# Patient Record
Sex: Female | Born: 1970 | Race: Black or African American | Hispanic: Refuse to answer | Marital: Single | Smoking: Former smoker
Health system: Southern US, Community
[De-identification: ages and names within clinical notes are randomized; demographics above are authoritative.]

## PROBLEM LIST (undated history)

## (undated) DIAGNOSIS — E785 Hyperlipidemia, unspecified: Secondary | ICD-10-CM

## (undated) DIAGNOSIS — I6990 Unspecified sequelae of unspecified cerebrovascular disease: Secondary | ICD-10-CM

## (undated) DIAGNOSIS — E559 Vitamin D deficiency, unspecified: Secondary | ICD-10-CM

## (undated) DIAGNOSIS — M810 Age-related osteoporosis without current pathological fracture: Secondary | ICD-10-CM

## (undated) DIAGNOSIS — J301 Allergic rhinitis due to pollen: Secondary | ICD-10-CM

## (undated) DIAGNOSIS — R7989 Other specified abnormal findings of blood chemistry: Secondary | ICD-10-CM

## (undated) DIAGNOSIS — F17200 Nicotine dependence, unspecified, uncomplicated: Secondary | ICD-10-CM

## (undated) DIAGNOSIS — R5381 Other malaise: Secondary | ICD-10-CM

## (undated) DIAGNOSIS — K5900 Constipation, unspecified: Secondary | ICD-10-CM

## (undated) DIAGNOSIS — I509 Heart failure, unspecified: Secondary | ICD-10-CM

## (undated) DIAGNOSIS — R569 Unspecified convulsions: Secondary | ICD-10-CM

## (undated) DIAGNOSIS — D869 Sarcoidosis, unspecified: Secondary | ICD-10-CM

## (undated) DIAGNOSIS — K2100 Gastro-esophageal reflux disease with esophagitis, without bleeding: Secondary | ICD-10-CM

## (undated) DIAGNOSIS — F1420 Cocaine dependence, uncomplicated: Secondary | ICD-10-CM

## (undated) DIAGNOSIS — F329 Major depressive disorder, single episode, unspecified: Secondary | ICD-10-CM

## (undated) DIAGNOSIS — F3289 Other specified depressive episodes: Secondary | ICD-10-CM

## (undated) DIAGNOSIS — G40909 Epilepsy, unspecified, not intractable, without status epilepticus: Secondary | ICD-10-CM

## (undated) DIAGNOSIS — F319 Bipolar disorder, unspecified: Secondary | ICD-10-CM

## (undated) DIAGNOSIS — G609 Hereditary and idiopathic neuropathy, unspecified: Secondary | ICD-10-CM

## (undated) DIAGNOSIS — G4730 Sleep apnea, unspecified: Secondary | ICD-10-CM

## (undated) DIAGNOSIS — J45909 Unspecified asthma, uncomplicated: Secondary | ICD-10-CM

## (undated) DIAGNOSIS — I10 Essential (primary) hypertension: Secondary | ICD-10-CM

## (undated) DIAGNOSIS — R5383 Other fatigue: Secondary | ICD-10-CM

## (undated) DIAGNOSIS — E669 Obesity, unspecified: Secondary | ICD-10-CM

## (undated) HISTORY — DX: Gastro-esophageal reflux disease with esophagitis, without bleeding: K21.00

## (undated) HISTORY — DX: Unspecified asthma, uncomplicated: J45.909

## (undated) HISTORY — DX: Unspecified sequelae of unspecified cerebrovascular disease: I69.90

## (undated) HISTORY — DX: Cocaine dependence, uncomplicated: F14.20

## (undated) HISTORY — DX: Major depressive disorder, single episode, unspecified: F32.9

## (undated) HISTORY — DX: Constipation, unspecified: K59.00

## (undated) HISTORY — PX: LUNG BIOPSY: SHX232

## (undated) HISTORY — DX: Allergic rhinitis due to pollen: J30.1

## (undated) HISTORY — DX: Sleep apnea, unspecified: G47.30

## (undated) HISTORY — DX: Hyperlipidemia, unspecified: E78.5

## (undated) HISTORY — DX: Sarcoidosis, unspecified: D86.9

## (undated) HISTORY — DX: Other specified abnormal findings of blood chemistry: R79.89

## (undated) HISTORY — PX: CHOLECYSTECTOMY: SHX55

## (undated) HISTORY — DX: Age-related osteoporosis without current pathological fracture: M81.0

## (undated) HISTORY — DX: Other fatigue: R53.83

## (undated) HISTORY — DX: Gastro-esophageal reflux disease with esophagitis: K21.0

## (undated) HISTORY — PX: FRACTURE SURGERY: SHX138

## (undated) HISTORY — DX: Unspecified convulsions: R56.9

## (undated) HISTORY — DX: Vitamin D deficiency, unspecified: E55.9

## (undated) HISTORY — DX: Other malaise: R53.81

## (undated) HISTORY — DX: Nicotine dependence, unspecified, uncomplicated: F17.200

## (undated) HISTORY — DX: Other specified depressive episodes: F32.89

## (undated) HISTORY — DX: Obesity, unspecified: E66.9

## (undated) HISTORY — DX: Hereditary and idiopathic neuropathy, unspecified: G60.9

---

## 2004-10-17 ENCOUNTER — Emergency Department: Payer: Self-pay | Admitting: Emergency Medicine

## 2005-01-14 ENCOUNTER — Emergency Department: Payer: Self-pay | Admitting: Emergency Medicine

## 2005-10-07 ENCOUNTER — Emergency Department: Payer: Self-pay | Admitting: Emergency Medicine

## 2005-11-07 ENCOUNTER — Emergency Department: Payer: Self-pay | Admitting: Internal Medicine

## 2005-11-11 ENCOUNTER — Ambulatory Visit: Payer: Self-pay | Admitting: Family Medicine

## 2005-11-14 ENCOUNTER — Emergency Department: Payer: Self-pay | Admitting: General Practice

## 2005-12-12 ENCOUNTER — Emergency Department: Payer: Self-pay | Admitting: General Practice

## 2005-12-12 ENCOUNTER — Other Ambulatory Visit: Payer: Self-pay

## 2006-01-20 ENCOUNTER — Emergency Department: Payer: Self-pay | Admitting: Emergency Medicine

## 2006-02-05 ENCOUNTER — Inpatient Hospital Stay (HOSPITAL_COMMUNITY): Admission: AD | Admit: 2006-02-05 | Discharge: 2006-02-12 | Payer: Self-pay | Admitting: Psychiatry

## 2006-02-06 ENCOUNTER — Ambulatory Visit: Payer: Self-pay | Admitting: Psychiatry

## 2006-05-22 ENCOUNTER — Emergency Department: Payer: Self-pay | Admitting: Emergency Medicine

## 2006-07-08 ENCOUNTER — Inpatient Hospital Stay: Payer: Self-pay | Admitting: Unknown Physician Specialty

## 2006-10-23 ENCOUNTER — Emergency Department (HOSPITAL_COMMUNITY): Admission: EM | Admit: 2006-10-23 | Discharge: 2006-10-23 | Payer: Self-pay | Admitting: Emergency Medicine

## 2007-02-23 ENCOUNTER — Emergency Department (HOSPITAL_COMMUNITY): Admission: EM | Admit: 2007-02-23 | Discharge: 2007-02-23 | Payer: Self-pay | Admitting: Emergency Medicine

## 2007-03-15 ENCOUNTER — Emergency Department (HOSPITAL_COMMUNITY): Admission: EM | Admit: 2007-03-15 | Discharge: 2007-03-15 | Payer: Self-pay | Admitting: Family Medicine

## 2007-03-27 ENCOUNTER — Emergency Department (HOSPITAL_COMMUNITY): Admission: EM | Admit: 2007-03-27 | Discharge: 2007-03-27 | Payer: Self-pay | Admitting: Emergency Medicine

## 2007-04-13 ENCOUNTER — Emergency Department: Payer: Self-pay | Admitting: General Practice

## 2007-08-13 ENCOUNTER — Ambulatory Visit: Payer: Self-pay

## 2008-02-18 ENCOUNTER — Ambulatory Visit: Payer: Self-pay | Admitting: Obstetrics and Gynecology

## 2008-02-26 ENCOUNTER — Ambulatory Visit: Payer: Self-pay | Admitting: Obstetrics and Gynecology

## 2008-06-28 ENCOUNTER — Ambulatory Visit: Payer: Self-pay

## 2008-06-29 ENCOUNTER — Other Ambulatory Visit: Payer: Self-pay

## 2008-06-30 ENCOUNTER — Inpatient Hospital Stay: Payer: Self-pay | Admitting: Internal Medicine

## 2008-08-16 ENCOUNTER — Ambulatory Visit: Payer: Self-pay | Admitting: Internal Medicine

## 2008-08-25 ENCOUNTER — Ambulatory Visit: Payer: Self-pay | Admitting: Internal Medicine

## 2008-09-01 ENCOUNTER — Ambulatory Visit: Payer: Self-pay | Admitting: Family Medicine

## 2008-09-01 ENCOUNTER — Ambulatory Visit: Payer: Self-pay | Admitting: Internal Medicine

## 2008-09-20 ENCOUNTER — Ambulatory Visit: Payer: Self-pay | Admitting: Thoracic Surgery (Cardiothoracic Vascular Surgery)

## 2008-09-22 ENCOUNTER — Ambulatory Visit: Payer: Self-pay | Admitting: Infectious Diseases

## 2008-09-26 ENCOUNTER — Inpatient Hospital Stay (HOSPITAL_COMMUNITY)
Admission: RE | Admit: 2008-09-26 | Discharge: 2008-09-30 | Payer: Self-pay | Admitting: Thoracic Surgery (Cardiothoracic Vascular Surgery)

## 2008-09-26 ENCOUNTER — Encounter: Payer: Self-pay | Admitting: Thoracic Surgery (Cardiothoracic Vascular Surgery)

## 2008-09-29 ENCOUNTER — Ambulatory Visit: Payer: Self-pay | Admitting: Thoracic Surgery (Cardiothoracic Vascular Surgery)

## 2008-10-19 ENCOUNTER — Ambulatory Visit: Payer: Self-pay | Admitting: Thoracic Surgery (Cardiothoracic Vascular Surgery)

## 2008-11-04 ENCOUNTER — Encounter
Admission: RE | Admit: 2008-11-04 | Discharge: 2008-11-04 | Payer: Self-pay | Admitting: Thoracic Surgery (Cardiothoracic Vascular Surgery)

## 2008-11-04 ENCOUNTER — Ambulatory Visit: Payer: Self-pay | Admitting: Thoracic Surgery (Cardiothoracic Vascular Surgery)

## 2009-01-18 ENCOUNTER — Emergency Department (HOSPITAL_COMMUNITY): Admission: EM | Admit: 2009-01-18 | Discharge: 2009-01-18 | Payer: Self-pay | Admitting: Emergency Medicine

## 2009-03-06 ENCOUNTER — Emergency Department (HOSPITAL_COMMUNITY): Admission: EM | Admit: 2009-03-06 | Discharge: 2009-03-06 | Payer: Self-pay | Admitting: Emergency Medicine

## 2009-03-24 ENCOUNTER — Emergency Department (HOSPITAL_COMMUNITY): Admission: EM | Admit: 2009-03-24 | Discharge: 2009-03-24 | Payer: Self-pay | Admitting: Emergency Medicine

## 2009-03-30 ENCOUNTER — Emergency Department (HOSPITAL_COMMUNITY): Admission: EM | Admit: 2009-03-30 | Discharge: 2009-03-30 | Payer: Self-pay | Admitting: Family Medicine

## 2009-06-04 ENCOUNTER — Emergency Department (HOSPITAL_COMMUNITY): Admission: EM | Admit: 2009-06-04 | Discharge: 2009-06-04 | Payer: Self-pay | Admitting: Family Medicine

## 2009-09-10 ENCOUNTER — Emergency Department (HOSPITAL_COMMUNITY): Admission: EM | Admit: 2009-09-10 | Discharge: 2009-09-11 | Payer: Self-pay | Admitting: Emergency Medicine

## 2009-09-15 ENCOUNTER — Emergency Department (HOSPITAL_COMMUNITY): Admission: EM | Admit: 2009-09-15 | Discharge: 2009-09-15 | Payer: Self-pay | Admitting: Family Medicine

## 2009-10-05 ENCOUNTER — Encounter: Admission: RE | Admit: 2009-10-05 | Discharge: 2009-10-05 | Payer: Self-pay | Admitting: Internal Medicine

## 2010-02-16 ENCOUNTER — Emergency Department (HOSPITAL_COMMUNITY): Admission: EM | Admit: 2010-02-16 | Discharge: 2010-02-17 | Payer: Self-pay | Admitting: Emergency Medicine

## 2010-03-26 ENCOUNTER — Encounter: Admission: RE | Admit: 2010-03-26 | Discharge: 2010-03-26 | Payer: Self-pay | Admitting: Family Medicine

## 2010-12-30 DIAGNOSIS — F1420 Cocaine dependence, uncomplicated: Secondary | ICD-10-CM

## 2010-12-30 HISTORY — DX: Cocaine dependence, uncomplicated: F14.20

## 2011-01-20 ENCOUNTER — Encounter: Payer: Self-pay | Admitting: Thoracic Surgery (Cardiothoracic Vascular Surgery)

## 2011-03-20 LAB — POCT I-STAT, CHEM 8
Chloride: 111 mEq/L (ref 96–112)
Creatinine, Ser: 0.9 mg/dL (ref 0.4–1.2)
Glucose, Bld: 122 mg/dL — ABNORMAL HIGH (ref 70–99)
Potassium: 3.6 mEq/L (ref 3.5–5.1)
Sodium: 144 mEq/L (ref 135–145)

## 2011-04-05 LAB — COMPREHENSIVE METABOLIC PANEL
ALT: 11 U/L (ref 0–35)
AST: 16 U/L (ref 0–37)
Alkaline Phosphatase: 51 U/L (ref 39–117)
CO2: 26 mEq/L (ref 19–32)
Calcium: 9.1 mg/dL (ref 8.4–10.5)
Chloride: 107 mEq/L (ref 96–112)
GFR calc non Af Amer: >60 mL/min (ref >60)
Glucose, Bld: 96 mg/dL (ref 70–99)
Sodium: 138 mEq/L (ref 135–145)
Total Bilirubin: 0.8 mg/dL (ref 0.3–1.2)

## 2011-04-05 LAB — CBC
Hemoglobin: 13.9 g/dL (ref 12.0–15.0)
MCHC: 34.5 g/dL (ref 30.0–36.0)
RBC: 4.32 MIL/uL (ref 3.87–5.11)
WBC: 8.1 10*3/uL (ref 4.0–10.5)

## 2011-04-05 LAB — WET PREP, GENITAL

## 2011-04-05 LAB — URINALYSIS, ROUTINE W REFLEX MICROSCOPIC
Bilirubin Urine: NEGATIVE
Ketones, ur: NEGATIVE mg/dL
Nitrite: NEGATIVE
Protein, ur: NEGATIVE mg/dL
Urobilinogen, UA: 1 mg/dL (ref 0.0–1.0)

## 2011-04-05 LAB — GC/CHLAMYDIA PROBE AMP, GENITAL
Chlamydia, DNA Probe: NEGATIVE
GC Probe Amp, Genital: NEGATIVE

## 2011-04-05 LAB — DIFFERENTIAL
Basophils Absolute: 0.1 10*3/uL (ref 0.0–0.1)
Basophils Relative: 2 % — ABNORMAL HIGH (ref 0–1)
Eosinophils Absolute: 0.4 10*3/uL (ref 0.0–0.7)
Eosinophils Relative: 5 % (ref 0–5)
Lymphs Abs: 2.1 10*3/uL (ref 0.7–4.0)
Neutrophils Relative %: 58 % (ref 43–77)

## 2011-04-05 LAB — HEMOGLOBIN AND HEMATOCRIT, BLOOD: HCT: 37.5 % (ref 36.0–46.0)

## 2011-04-05 LAB — PREGNANCY, URINE: Preg Test, Ur: NEGATIVE

## 2011-04-10 LAB — POCT RAPID STREP A (OFFICE): Streptococcus, Group A Screen (Direct): NEGATIVE

## 2011-04-11 LAB — WET PREP, GENITAL
Trich, Wet Prep: NONE SEEN
Yeast Wet Prep HPF POC: NONE SEEN

## 2011-04-11 LAB — POCT URINALYSIS DIP (DEVICE)
Bilirubin Urine: NEGATIVE
Hgb urine dipstick: NEGATIVE
Nitrite: NEGATIVE
Protein, ur: 30 mg/dL — AB
pH: 6.5 (ref 5.0–8.0)

## 2011-05-14 NOTE — Assessment & Plan Note (Signed)
OFFICE VISIT   HOGSTON, Adisson A  DOB:  1971-10-13                                        November 04, 2008  CHART #:  [id number]   The patient is a 40 year old woman who had a left VATS, lung biopsy,  mediastinal node biopsies on September 26, 2008, for what turned out to  be sarcoidosis and originally Pathology and ID were concerned that this  might be tuberculosis, but no organisms were seen and the findings were,  in fact, more consistent with sarcoidosis.  She has been treated with  steroids.  She states that her breathing has been good.  She is having  some incisional pain and also some numbness or feeling of a lump in the  left subcostal region.   PHYSICAL EXAMINATION:  GENERAL:  The patient is a 40 year old African  American female in no acute distress.  VITAL SIGNS:  Her blood pressure 143/91, pulse 54, respirations are 18,  her oxygen saturation is 97% on room air.  LUNGS:  Clear with equal breath sounds.  SKIN:  Her incisions are all healing well with no signs of infection.   Chest x-ray shows some improvement of her patchy bilateral infiltrates.   IMPRESSION:  The patient is a 39 year old woman with sarcoid.  She is  status post left VATS for biopsy of mediastinal nodes and lung.  Her  adenopathy was more in the AP window than it was paratracheal that is  why a VATS approach was utilized.  She is doing well at this point in  time.  She does still have some discomfort.  I gave her additional  prescription for hydrocodone/acetaminophen 7.5/500 one to two tablets 3  times daily as needed and gave her #50 tablets.  No refills.  I  encouraged her to use Tylenol or ibuprofen for achy-type pain and only  to use hydrocodone when the pain was more severe.  Her activities are  unrestricted, but she is to  avoid things that cause significant discomfort.  She will continue to be  followed by Dr. Welton Flakes.  I would be happy to see her back anytime if I  could  be of any further assistance with her care.   Salvatore Decent Dorris Fetch, M.D.  Electronically Signed   SCH/MEDQ  D:  11/04/2008  T:  11/04/2008  Job:  [id number]   cc:   Freda Munro

## 2011-05-14 NOTE — Discharge Summary (Signed)
[name], [name]              ACCOUNT NO.:  [id number]   MEDICAL RECORD NO.:  [id number]          PATIENT TYPE:  INP   LOCATION:  2033                         FACILITY:  [facility]   PHYSICIAN:  Salvatore Decent. Dorris Fetch, M.D.DATE OF BIRTH:  [birth year]   DATE OF ADMISSION:  09/26/2008  DATE OF DISCHARGE:  09/30/2008                               DISCHARGE SUMMARY   PRIMARY ADMITTING DIAGNOSIS:  Bilateral pulmonary infiltrates.   ADDITIONAL/DISCHARGE DIAGNOSES:  1. Bilateral pulmonary infiltrates questionable sarcoidosis.  2. History of tobacco abuse.  3. Hypertension.  4. Arthritis.  5. Gastroesophageal reflux.   PROCEDURE PERFORMED:  Left video-assisted thoracoscopic surgery, left  upper lobe wedge resection, and lymph node dissection.   HISTORY:  The patient is a 40 year old female who developed shortness of  breath around June 2009 and was diagnosed and treated for pneumonia at  that time by Dr. Welton Flakes.  She underwent a followup chest CT and this  showed interstitial and alveolar lung disease prominently in the upper  lobes with mediastinal and hilar adenopathy.  She returned for a  followup CT scan in August 2009 which showed significant change.  She  underwent a bronchoscopy which was diagnostic.  She has been treated  with antibiotics prednisone and Symbicort with a persistent cough but  improvement of her shortness of breath.  She has continued to have some  mild dyspnea, but has had no weight loss or hemoptysis.  She was  referred to Dr. Charlett Lango for consideration of surgical biopsy  for diagnosis.  Dr. Dorris Fetch reviewed her CT scan which confirmed  bilateral interstitial lung disease with some nodularity as well as  alveolar infiltrates bilaterally.  There was some minimal hilar and  mediastinal adenopathy and prominent adenopathy in the AP window.  Dr.  Dorris Fetch felt that she would benefit from a VATS with lung biopsy at  this time.  He explained the risks,  benefits, and alternatives of the  procedure to the patient, and she agreed to proceed.   HOSPITAL COURSE:  She was admitted to [facility] on September 26, 2008.  She underwent a left VATS with left upper lobe wedge  resection.  Her intraoperative frozen section showed granulomatous  disease with central necrosis.  She tolerated the procedure well and was  transferred to the Intermediate Care Unit postoperatively in stable  condition.  Because of her lung biopsy findings, an ID consult was  obtained and the patient was seen by Dr. Ninetta Lights.  She was placed on  respiratory isolation for presumed TB.  Her postoperative course has  progressed as expected.  Her chest tube drainage gradually decreased and  no air leak was noted and all chest tubes were discontinued by  postoperative day 2.  She has been ambulating in the hall without  problem.  She is tolerating her regular diet.  She has been afebrile,  and her vital signs have been stable.  She is maintaining O2 sats of  greater than 90% on room air.  Her AFB smear came back negative,  although cultures are still pending at this time.  Other  tests including  GC, Chlamydia, HIV, and RPR were all negative.  Her final intraoperative  pathology was positive for granulomatous inflammation with rare focus of  necrosis.  There was no evidence of malignancy.  Again stains were  negative for AFB.  It was Dr. Moshe Cipro opinion that this was most  likely consistent with sarcoid rather than tuberculosis.  Because of  these findings, her respiratory isolation was discontinued.  She has now  been transferred to the Step-down Unit and is progressing well.  Her  most recent labs showed a hemoglobin of 13.5, hematocrit of 40, white  count 13.6, and platelets 375.  Sodium 140, potassium 4.1, BUN 7, and  creatinine 0.75.  Both urine histo and serum crypto antigens have been  ordered but remained pending at the time of this dictation.  She will   undergo a PA and lateral chest x-ray on the morning of September 30, 2008.  It was anticipated that she has remained stable on her chest x-ray, has  continued to improve.  She hopefully will be ready for discharge home.   DISCHARGE MEDICATIONS:  1. Alprazolam 1 mg p.r.n. as directed.  2. Ambien CR 12.5 mg nightly.  3. Oxycodone 5/325 mg 1-2 q.4-6 h. p.r.n. for pain.  4. Pulmicort 2 puffs daily as directed.  5. Predinsone 40 mg daily.  6. Micardis 80 mg daily.  7. Peri-Colace 1 q.p.m.  8. Kapidex 60 mg daily.  9. Boniva monthly.   DISCHARGE INSTRUCTIONS:  She was asked to refrain from driving, heavy  lifting, or strenuous activity.  She may continue ambulating daily and  using her incentive spirometer.  She may shower daily and clean her  incisions with soap and water.   DISCHARGE FOLLOWUP:  She will follow up with Dr. Dorris Fetch on October 27, 2008, at 12:45 p.m.  She will have chest x-ray prior to this visit.  If she experiences any problems, or has questions in the interim, she is  asked to contact our office immediately.  Also we will followup on her  cultures and if she needs further ID followup, this will be arranged at  the time of discharge.      Coral Ceo, P.A.      Salvatore Decent Dorris Fetch, M.D.  Electronically Signed    GC/MEDQ  D:  09/29/2008  T:  09/30/2008  Job:  [id number]   cc:   Lacretia Leigh. Ninetta Lights, M.D.  Freda Munro

## 2011-05-14 NOTE — Op Note (Signed)
NAMELACIE, LANDRY              ACCOUNT NO.:  [id number]   MEDICAL RECORD NO.:  [id number]          PATIENT TYPE:  INP   LOCATION:  3301                         FACILITY:  [facility]   PHYSICIAN:  Salvatore Decent. Dorris Fetch, M.D.DATE OF BIRTH:  [birth year]   DATE OF PROCEDURE:  09/26/2008  DATE OF DISCHARGE:                               OPERATIVE REPORT   PREOPERATIVE DIAGNOSIS:  Bilateral pulmonary infiltrates and adenopathy.   POSTOPERATIVE DIAGNOSIS:  Granulomatous disease with central necrosis.   PROCEDURE:  Left VATS, lung biopsy, and mediastinal node biopsy.   SURGEON:  Salvatore Decent. Dorris Fetch, MD   ASSISTANT:  Doree Fudge, PA.   ANESTHESIA:  General.   FINDINGS:  Miliary type pattern visible on the visceral pleural surface,  extensive scarring of the lung, enlarged AP window lymph nodes.  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   CLINICAL NOTE:  Ms. Schlotterbeck is a 40 year old woman with bilateral  pulmonary infiltrates and mediastinal and hilar adenopathy.  She had  been treated with steroids and antibiotics but has had persistent cough.  She says that the cough has been nonproductive.  She also has a history  of tobacco abuse as well as some psychiatric history.  The patient had a  bronchoscopy which was nondiagnostic.  She was advised to undergo left  VATS with lung and node biopsies versus mediastinoscopy.  Relative  advantages and disadvantages of each approach were discussed and the  patient strongly wished to proceed with VATS approach.  She understood  the indications, risks, benefits, and alternatives, accepted risks and  agreed to proceed.   OPERATIVE NOTE:  Ms. Counts was brought to the preop holding area on  September 26, 2008.  There Anesthesia established intravenous access,  central venous access, and arterial blood pressure monitoring catheter  was placed.  PAS hose were placed for DVT prophylaxis.  She was taken to  the  operating room, anesthetized, and intubated.  A Foley catheter was  placed.  She was intubated with a double-lumen endotracheal tube.  She  was placed in the right lateral decubitus position, and the left chest  was prepped and draped in the usual fashion.  Single lung ventilation of  right lung was carried out and was tolerated well throughout the  procedure.   A transverse incision was made approximately at the seventh intercostal  space in the midaxillary line and was carried through the skin and  subcutaneous tissue.  The chest was entered bluntly using hemostat.  A  port was inserted and the thoracoscope was placed.  There was residual  air in the lung although no significant cross ventilation.  A suction  catheter was placed into the left side with the double-lumen tube and  this helped significantly with evacuating the air from the lung.  There  was no pleural effusion.  The parietal pleura appeared normal.  There  was a miliary-type pattern seen in the lung.  Additional access ports  were placed anterior and posteriorly in the fifth intercostal space.  The lung was retracted laterally exposing the hilum and AP window.  A  small hilar node was identified.  This did not appear pathologic.  It  was taken and sent for permanent sections.  There was significant  adenopathy in the AP window distribution.  Nodes were sampled from this  area taking care to preserve the phrenic and recurrent nerves.  Finally  the area in the lingula with a miliary pattern of fine nodules was  identified and resected with multiple firings of Endo GIA type stapler  with 4.8 mm staple depth.  The specimen was divided.  The site of  nodularity was marked and sent for frozen section.  Additional side  nodularity was sent for cultures for bacterial, fungus, and AFB.  The  frozen section returned granuloma with central necrosis concerning for  tuberculosis.  The AP window nodes were divided and sent both for   pathology as well as AFB stains and cultures.  A 28-French chest tube  was placed in the anterior most incision and an On-Q local anesthetic  catheter was tunneled into a subpleural location and 0.25% Marcaine was  injected.  The lung was inflated.  The staple line was inspected as was  the site where the nodal dissection was performed.  The lung was  reinflated.  The chest tube was placed to suction.  The patient was  extubated in the operating room and taken to the recovery room in good  condition.      Salvatore Decent Dorris Fetch, M.D.  Electronically Signed     SCH/MEDQ  D:  09/26/2008  T:  09/27/2008  Job:  [id number]   cc:   Freda Munro

## 2011-05-14 NOTE — H&P (Signed)
HISTORY AND PHYSICAL EXAMINATION   September 20, 2008   Re:  RAYLEN, KEN          DOB:  06/25/1971   The patient is a 40 year old woman sent for consultation regarding  possible sarcoidosis.   The patient is a 40 year old woman with a past medical history  significant for hypertension and arthritis.  She also has a history of  tobacco abuse.  She did not notice any difficulty with her breathing up  until June of this year when she was experiencing shortness of breath.  She was diagnosed with pneumonia and treated with antibiotics.  She was  seen by Dr. Welton Flakes.  A chest CT showed interstitial and alveolar lung  disease predominantly in the upper lobes.  There was also some  mediastinal and hilar adenopathy.  She had a repeat CT scan done in  August, which showed no significant change.  She underwent bronchoscopy,  which was nondiagnostic.  She has been treated with Symbicort,  prednisone, and antibiotics.  She currently is on erythromycin.  She has  had persistent cough.  Her shortness of breath has improved, but not  resolved.  Her breathing is still worse than it was prior to getting  ill.   The patient denies any fevers or chills but says she has been having hot  flashes, she had an IUD put in recently.  She says she has been tested  for pregnancy and that has been negative.  She also has chronic back and  shoulder pain and is on oxycodone for that.   She does smoke.  She had quit smoking 2 years ago, but had recently  restarted and says she is now smoking less than half a pack daily.  She  denies any pre-existing history of asthma or other lung disease.   CURRENT MEDICATIONS:  Oxycodone p.r.n., Xanax 1 mg p.o. t.i.d. p.r.n.,  erythromycin 875 mg, budesonide 4.5 mcg 2 puffs b.i.d., Boniva once a  month, Colace 100 mg b.i.d., prednisone 40 mg daily, Kapidex 60 mg 1  daily, Micardis 20 mg t.i.d., polyethylene glycol 17 g daily p.r.n.,  Tessalon Perles 100 mg  q.4 h. p.r.n., and Zolpidem 12.5 mg p.o. q.h.s.  p.r.n.   ALLERGIES:  She has allergies to Naprosyn, Risperdal, and Seroquel, all  of which caused tongue swelling and skin rash.   FAMILY HISTORY:  Significant for father having MI and also history of  arthritis and asthma in her father.   SOCIAL HISTORY:  She is unable to work.  She is on disability.  Smoking  as described.  She smoked about a pack a day.  He quit 2 years ago, but  now currently she is smoking 4 to 8 cigarettes daily over the past  several months.   REVIEW OF SYSTEMS:  Headaches, nasal congestion, hot flashes since IUD  placement, back pain, shoulder pain with nerve damage, cough since  Saturday, shortness of breath improved, but still feeling of tightness,  and occasional swelling in her legs.   PHYSICAL EXAMINATION:  GENERAL:  The patient is a 40 year old [place]  American female, in no acute distress.  She is well developed and well  nourished.  VITAL SIGNS:  Her blood pressure is 175/98, pulse 68, respirations 18,  and her oxygen saturation is 97% on room air.  NEUROLOGIC:  She is alert and oriented x3 with no focal deficits.  HEENT:  Unremarkable.  NECK:  There is some prominence on the right side.  No definite  adenopathy.  Question mild thyromegaly.  LUNGS:  Have bronchial breath sounds bilaterally.  CARDIAC:  Regular rate and rhythm.  Normal S1 and S2.  No rubs or  murmurs.  ABDOMEN:  Soft and nontender.  EXTREMITIES:  Without clubbing, cyanosis, or edema.   Chest CT was reviewed.  There is bilateral interstitial lung disease  with some nodularity as well as alveolar infiltrates bilaterally.  There  is some minimal hilar and mediastinal adenopathy.  There is some more  prominent adenopathy in the AP window.   Pulmonary function testing showed an FEV1 of 1.8 with 74% of predicted.  FVC is 2.58, 89% of predicted.  Ratio is 70.  Total lung capacity 4.05.  Diffusion capacity is 57% of predicted.  This is  consistentwith mild  obstructive disease with moderate reduced diffusion capacity.  There was  no improvement with bronchodilators.   IMPRESSION:  The patient is a 40 year old woman with bilateral pulmonary  infiltrates and adenopathy.  She has been markedly symptomatic.  She is  being treated for presumptive sarcoidosis, but bronchoscopy was not able  to reveal diagnosis.  The pulmonary infiltrates are more impressive on  scan than the adenopathy, although that is present as well.  The  differential diagnosis includes sarcoid, which is the most likely, but  also a fungal infection, mycobacterial infection, or eosinophilic  pneumonitis or granulomatous disease.  I discussed with her options for  diagnosis and discussed 2 separate approaches.  One would be  mediastinoscopy and biopsy of her mediastinal nodes.  I discussed with  her that this was likely to give [place] a diagnosis and can be done as an  outpatient and with minimal discomfort and with probably potential 10%  chance of not establishing a diagnosis.  The other option would be to do  a video-assisted thoracoscopic surgery with lung biopsy and node biopsy  as well, which would be more likely to give [place] a definitive diagnosis,  but would involve increased morbidity as well as hospital stay more  prolonged recovery.  I discussed these issues with her in great detail  and encouraged her to proceed with mediastinoscopy first and if that was  nonrevealing, we could subsequently do a VATS biopsy.  However, she very  strongly wishes to proceed with VATS biopsy as the initial approach.  As  noted, we will approach from the left side, which will give [place] access to  the AP window nodes in addition to the lung.   She does understand that she will need a chest tube postoperatively and  will be in the hospital probably 3 to 4 days postoperatively.  We have  tentatively scheduled her surgery for Monday September 26, 2008.  She  will be admitted  on the day of surgery.  She understands the risks of  surgery include, but are not limited to death, MI, DVT, PE, bleeding,  possible need for transfusions, infections, prolonged air leak as well  as other unpredictable complications.  She accepts these risks and  agrees to proceed.   Salvatore Decent Dorris Fetch, M.D.  Electronically Signed   SCH/MEDQ  D:  09/20/2008  T:  09/20/2008  Job:  [id number]   cc:   Freda Munro

## 2011-05-17 NOTE — Discharge Summary (Signed)
[name], [name]              ACCOUNT NO.:  [id number]   MEDICAL RECORD NO.:  [id number]          PATIENT TYPE:  IPS   LOCATION:  0305                          [facility]ACILITY:  [facility]   PHYSICIAN:  Jeanice Lim, M.D. DATE OF BIRTH:  01-19-1971   DATE OF ADMISSION:  02/05/2006  DATE OF DISCHARGE:  02/12/2006                                 DISCHARGE SUMMARY   IDENTIFYING DATA:  A 40 year old single African-American female,  involuntarily committed, with a history of wanting to get a knife and kill  herself.  History of alcohol and cocaine, stated her psychiatrist calls her  weekly and when she called the patient told her she had used cocaine, and  was drinking, had auditory hallucinations to hurt herself, relapsing on  cocaine, had been clean for 4 months, had not slept for 2 days, had been off  medications for 2 days.  The patient was recommended to go to a long-term  program.  [facility] admission, history of cutting her  wrists 6 years ago, at [facility], Dr. Andrey Campanile, attending AA  and NA.  Smokes 1 pack per day of cigarettes.  Recent alcohol use and crack  cocaine.   MEDICATIONS:  Topamax, hydrochlorothiazide, Prilosec, Celexa, Abilify,  Lexapro and Allegra.   ALLERGIES:  SEROQUEL, NAPROSYN, RISPERDAL which caused galactosuria.   PHYSICAL AND NEUROLOGICAL EXAMINATION:  Essentially within normal limits  except for obesity.   MENTAL STATUS EXAM:  Sleepy but awake female in no acute distress.  Review  of systems negative.  No psychomotor abnormalities.  Speech clear, mood  tired, affect euthymic but polite.  Thought processes goal directed,  endorsing auditory hallucinations, currently cognitively intact.  Judgment  and insight were fair, but poor impulse control.   ADMISSION DIAGNOSES:  AXIS I:  Bipolar disorder, alcohol abuse, cocaine  abuse.  AXIS II:  Deferred.  AXIS III:  Hypertension and asthma, gastroesophageal reflux disease and  tobacco abuse.  AXIS IV:  AXIS V:  30/5-60.   HOSPITAL COURSE:  The patient was admitted and ordered routine p.r.n.  medications, underwent further monitoring, and was encouraged to participate  in individual, group and milieu therapy.  The patient was to work on  increased coping skills.  Medications were started to target mood  instability symptoms and substance abuse treatment.  Placement was worked  on.  The patient initially reported auditory hallucinations commanding the  patient to hurt self.  The patient gradually reported improvement as  medications were optimized, had some medical complaints including nausea and  vomiting but continued to show gradual improvement and eventually was  discharged after family members were involved along with the patient in  aftercare planning, requesting the patient be transferred to [facility].  Family  had hoped the patient could get longer inpatient treatment despite the  patient not being willing to do so and not interested in options available.  It was explained to family that she did not have commitment criteria and  this would be violating her rights and illegal for [place] to do, and therefore  the patient after treatment team discussed  what was in the patient's best  interests as well as patient safety issues were reviewed, the patient was  discharged in improved condition, with no dangerous ideation, including  suicidal or homicidal thoughts, no command hallucinations, no overt  psychotic symptoms.  Mood was stable.  The patient reported motivation to  remain abstinent and compliant with medications and was given medication  education reviewed at the time of discharge and discharged on:  1.  Abilify 20 mg q.8 p.m.  2.  Ambien 10 mg q.h.s. p.r.n.  3.  Symmetrel 100 mg b.i.d.  4.  Topamax 100 mg 1 q.h.s.  5.  Celexa 40 mg q.a.m.  6.  Hydrochlorothiazide 12.5 mg q.a.m.  7.  Protonix 40 mg q.a.m.  8.  Claritin 10 mg q.a.m.   DISPOSITION:  To  follow up with Casandra Doffing at [facility]  Monday, February 19 at 2 p.m.   DISCHARGE DIAGNOSES:  AXIS I:  Bipolar disorder, alcohol abuse, cocaine  abuse.  AXIS II:  Deferred.  AXIS III:  Hypertension and asthma, gastroesophageal reflux disease and  tobacco abuse.  AXIS IV:  AXIS V:  Global assessment of functioning on discharge was 55-60.      Jeanice Lim, M.D.  Electronically Signed     JEM/MEDQ  D:  03/10/2006  T:  03/10/2006  Job:  [id number]

## 2011-05-17 NOTE — H&P (Signed)
[name], [name]              ACCOUNT NO.:  [id number]   MEDICAL RECORD NO.:  [id number]          PATIENT TYPE:  IPS   LOCATION:  0305                          FACILITY:  [facility]   PHYSICIAN:  Jeanice Lim, M.D. DATE OF BIRTH:  1971-11-28   DATE OF ADMISSION:  02/05/2006  DATE OF DISCHARGE:                         PSYCHIATRIC ADMISSION ASSESSMENT   IDENTIFYING INFORMATION:  This is a 40 year old single African-American  female involuntarily committed on February 05, 2006.   HISTORY OF PRESENT ILLNESS:  The patient presents with a history of being  here on petition.  Papers state that the patient went to get a knife to kill  herself.  The patient has been using alcohol and cocaine.  The patient  states her psychiatrist called her on a weekly basis and when she had called  the patient, the patient told her that she had been using cocaine, drinking,  and was experiencing positive auditory hallucinations to hurt herself.  The  patient relapsed on cocaine after being clean for 4 months.  She states she  has not slept in 2 days and has been off her medications for 2 days.  The  patient states that she would like to go to a long-term program.   PAST PSYCHIATRIC HISTORY:  First admission to [facility], has  a history of cutting her wrists 6 years ago, was hospitalized at [facility].  Her psychiatrist is Dr. Raliegh Scarlet.  The patient states she has been  attending NA and AA meetings.   SOCIAL HISTORY:  She is a 40 year old single African-American female, has 2  children ages 19 and 62.  Children are with the father of those children.  The patient lives with a boyfriend.  She is on medical disability.   FAMILY HISTORY:  Unclear.   ALCOHOL DRUG HISTORY:  The patient smokes a pack a day, reports recent  alcohol and cocaine use.   PAST MEDICAL HISTORY:  Psychiatrist is Dr. Raliegh Scarlet.  Aquilla Solian is her  primary care Kenton Fortin in [place] at [facility].   Medical  problems are asthma, hypertension, gastroesophageal reflux disease and a  series of seizure with last seizure being 3 years ago.  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   MEDICATIONS:  Topamax 100 mg daily, hydrochlorothiazide 12.5 mg daily,  Celexa 40 mg daily, Abilify 20 mg daily, Lexapro 20 mg daily.  Again has  been off all these medications for 2 days.   DRUG ALLERGIES:  SEROQUEL with EPS symptoms, NAPROSYN, RISPERDAL.  The  patient had problems with galactorrhea with Risperdal.   PHYSICAL EXAMINATION:  This is an overweight female, sleepy, in no acute  distress.  Review of systems:  No chest pain, history of shortness of  breath, history of hypertension, history of seizures with last seizure being  3 years ago.  No falling or weakness.  No hematological problems, no  endocrine problems, episodes of reflux on medication.  No frequency or  urgency, no impaired vision.  Her temperature is 97.8, heart rate is 76,  respiratory rate of 20, 99% saturation.  She is 5  feet 3 inches tall, 192  pounds.  This is an overweight female in no acute distress.  Negative  lymphadenopathy.  Trachea is midline.  CHEST:  Clear.  BREAST EXAM:  Deferred.  HEART:  Regular rate and rhythm.  ABDOMEN:  Soft, nontender abdomen.  GU:  Deferred.  EXTREMITIES:  The patient moves all extremities.  No clubbing, no edema, no  deformity.  SKIN:  Warm and dry, 5+ against resistance.  NEUROLOGICAL FINDINGS:  Intact and nonfocal.   LABORATORY DATA:  CBC is within normal limits.  Potassium is 3.1.  BUN is 5.  TSH is 0.768.  Urinalysis is negative.  Urine drug screen is unavailable at  this time.   MENTAL STATUS EXAM:  Sleepy overweight female, no acute distress.  Speech is  clear, normal rate and tone.  The patient feels tired.  Affect is euthymic.  The patient is polite.  Thought processes are endorsing auditory  hallucinations, does not appear to be responding currently.  Cognitive  function  intact.  Memory is fair, judgment and insight is fair, poor impulse  control.   ADMISSION DIAGNOSES:  AXIS I:  Bipolar disorder, alcohol and cocaine abuse.  AXIS II:  Deferred.  AXIS III:  Hypertension, gastroesophageal reflux disease, asthma, and  tobacco abuse.  AXIS IV:  Other psychosocial problems, medical problems.  AXIS V:  Current is 30.   PLAN:  Plan is to stabilize mood and thinking.  We will resume the patient's  medications.  The patient is to increase her coping skills.  We will work on  relapse prevention.  Case manager is to address a long-term rehab facility.  The patient is to continue to follow with AA and NA for support.   TENTATIVE LENGTH OF CARE:  4-6 days.      Landry Corporal, N.P.      Jeanice Lim, M.D.  Electronically Signed    JO/MEDQ  D:  02/10/2006  T:  02/10/2006  Job:  [id number]

## 2011-08-30 ENCOUNTER — Emergency Department (HOSPITAL_COMMUNITY)
Admission: EM | Admit: 2011-08-30 | Discharge: 2011-08-30 | Disposition: A | Discharge status: Other Acute Inpatient Hospital | Payer: PRIVATE HEALTH INSURANCE | Source: Home / Self Care | Attending: Emergency Medicine | Admitting: Emergency Medicine

## 2011-08-30 ENCOUNTER — Emergency Department (HOSPITAL_COMMUNITY): Payer: PRIVATE HEALTH INSURANCE

## 2011-08-30 ENCOUNTER — Inpatient Hospital Stay (HOSPITAL_COMMUNITY)
Admission: EM | Admit: 2011-08-30 | Discharge: 2011-09-04 | DRG: 066 | Disposition: A | Discharge status: Home / Self Care | Payer: PRIVATE HEALTH INSURANCE | Source: Ambulatory Visit | Attending: Internal Medicine | Admitting: Internal Medicine

## 2011-08-30 ENCOUNTER — Encounter (HOSPITAL_COMMUNITY): Payer: Self-pay

## 2011-08-30 DIAGNOSIS — A5901 Trichomonal vulvovaginitis: Secondary | ICD-10-CM | POA: Diagnosis present

## 2011-08-30 DIAGNOSIS — R5381 Other malaise: Secondary | ICD-10-CM | POA: Insufficient documentation

## 2011-08-30 DIAGNOSIS — E876 Hypokalemia: Secondary | ICD-10-CM | POA: Diagnosis present

## 2011-08-30 DIAGNOSIS — I6350 Cerebral infarction due to unspecified occlusion or stenosis of unspecified cerebral artery: Secondary | ICD-10-CM | POA: Insufficient documentation

## 2011-08-30 DIAGNOSIS — K219 Gastro-esophageal reflux disease without esophagitis: Secondary | ICD-10-CM | POA: Diagnosis present

## 2011-08-30 DIAGNOSIS — M810 Age-related osteoporosis without current pathological fracture: Secondary | ICD-10-CM | POA: Diagnosis present

## 2011-08-30 DIAGNOSIS — I498 Other specified cardiac arrhythmias: Secondary | ICD-10-CM | POA: Insufficient documentation

## 2011-08-30 DIAGNOSIS — R209 Unspecified disturbances of skin sensation: Secondary | ICD-10-CM | POA: Diagnosis present

## 2011-08-30 DIAGNOSIS — F17200 Nicotine dependence, unspecified, uncomplicated: Secondary | ICD-10-CM | POA: Diagnosis present

## 2011-08-30 DIAGNOSIS — G589 Mononeuropathy, unspecified: Secondary | ICD-10-CM | POA: Diagnosis present

## 2011-08-30 DIAGNOSIS — I10 Essential (primary) hypertension: Secondary | ICD-10-CM | POA: Diagnosis present

## 2011-08-30 DIAGNOSIS — E663 Overweight: Secondary | ICD-10-CM | POA: Diagnosis present

## 2011-08-30 DIAGNOSIS — E785 Hyperlipidemia, unspecified: Secondary | ICD-10-CM | POA: Diagnosis present

## 2011-08-30 HISTORY — DX: Essential (primary) hypertension: I10

## 2011-08-30 HISTORY — DX: Epilepsy, unspecified, not intractable, without status epilepticus: G40.909

## 2011-08-30 HISTORY — DX: Heart failure, unspecified: I50.9

## 2011-08-30 HISTORY — DX: Bipolar disorder, unspecified: F31.9

## 2011-08-30 LAB — DIFFERENTIAL
Basophils Relative: 1 % (ref 0–1)
Eosinophils Absolute: 0.5 10*3/uL (ref 0.0–0.7)
Lymphs Abs: 2.4 10*3/uL (ref 0.7–4.0)
Monocytes Relative: 7 % (ref 3–12)
Neutro Abs: 4.7 10*3/uL (ref 1.7–7.7)
Neutrophils Relative %: 57 % (ref 43–77)

## 2011-08-30 LAB — CBC
Hemoglobin: 14.7 g/dL (ref 12.0–15.0)
MCH: 31.2 pg (ref 26.0–34.0)
Platelets: 342 10*3/uL (ref 150–400)
RBC: 4.71 MIL/uL (ref 3.87–5.11)
RDW: 11.6 % (ref 11.5–15.5)
WBC: 8.3 10*3/uL (ref 4.0–10.5)

## 2011-08-30 LAB — COMPREHENSIVE METABOLIC PANEL
ALT: 29 U/L (ref 0–35)
AST: 25 U/L (ref 0–37)
Alkaline Phosphatase: 94 U/L (ref 39–117)
CO2: 23 mEq/L (ref 19–32)
Chloride: 104 mEq/L (ref 96–112)
GFR calc Af Amer: >60 mL/min (ref >60)
GFR calc non Af Amer: >60 mL/min (ref >60)
Glucose, Bld: 128 mg/dL — ABNORMAL HIGH (ref 70–99)
Potassium: 3.5 mEq/L (ref 3.5–5.1)
Sodium: 139 mEq/L (ref 135–145)
Total Bilirubin: 0.5 mg/dL (ref 0.3–1.2)

## 2011-08-30 LAB — RAPID URINE DRUG SCREEN, HOSP PERFORMED
Barbiturates: NOT DETECTED
Benzodiazepines: NOT DETECTED

## 2011-08-30 LAB — PREGNANCY, URINE: Preg Test, Ur: NEGATIVE

## 2011-08-31 DIAGNOSIS — R209 Unspecified disturbances of skin sensation: Secondary | ICD-10-CM

## 2011-08-31 LAB — HEMOGLOBIN A1C
Hgb A1c MFr Bld: 5.1 % (ref <5.7)
Mean Plasma Glucose: 97 mg/dL (ref <117)
Mean Plasma Glucose: 100 mg/dL (ref <117)

## 2011-08-31 LAB — CARDIAC PANEL(CRET KIN+CKTOT+MB+TROPI)
Relative Index: INVALID (ref 0.0–2.5)
Troponin I: <0.3 ng/mL (ref <0.30)

## 2011-08-31 LAB — URINALYSIS, ROUTINE W REFLEX MICROSCOPIC
Bilirubin Urine: NEGATIVE
Ketones, ur: NEGATIVE mg/dL
Nitrite: NEGATIVE
Protein, ur: NEGATIVE mg/dL
pH: 6 (ref 5.0–8.0)

## 2011-08-31 LAB — APTT: aPTT: 29 seconds (ref 24–37)

## 2011-08-31 LAB — LIPID PANEL: HDL: 46 mg/dL (ref >39)

## 2011-08-31 LAB — URINE MICROSCOPIC-ADD ON

## 2011-08-31 LAB — PROTIME-INR
INR: 0.97 (ref 0.00–1.49)
Prothrombin Time: 13.1 seconds (ref 11.6–15.2)

## 2011-09-01 LAB — BASIC METABOLIC PANEL
BUN: 11 mg/dL (ref 6–23)
Chloride: 102 mEq/L (ref 96–112)
GFR calc Af Amer: >60 mL/min (ref >60)
Potassium: 3.4 mEq/L — ABNORMAL LOW (ref 3.5–5.1)

## 2011-09-01 LAB — URINALYSIS, ROUTINE W REFLEX MICROSCOPIC
Nitrite: NEGATIVE
Specific Gravity, Urine: 1.016 (ref 1.005–1.030)
pH: 8.5 — ABNORMAL HIGH (ref 5.0–8.0)

## 2011-09-01 LAB — URINE MICROSCOPIC-ADD ON

## 2011-09-02 LAB — BASIC METABOLIC PANEL
BUN: 10 mg/dL (ref 6–23)
Calcium: 9.4 mg/dL (ref 8.4–10.5)
Creatinine, Ser: 0.53 mg/dL (ref 0.50–1.10)
GFR calc Af Amer: >60 mL/min (ref >60)

## 2011-09-02 LAB — CBC
MCH: 31.1 pg (ref 26.0–34.0)
MCV: 88.6 fL (ref 78.0–100.0)
Platelets: 324 10*3/uL (ref 150–400)
RDW: 11.7 % (ref 11.5–15.5)

## 2011-09-02 LAB — URINE CULTURE: Culture: NO GROWTH

## 2011-09-03 LAB — GC/CHLAMYDIA PROBE AMP, URINE
Chlamydia, Swab/Urine, PCR: NEGATIVE
GC Probe Amp, Urine: NEGATIVE

## 2011-09-24 ENCOUNTER — Ambulatory Visit: Payer: PRIVATE HEALTH INSURANCE | Attending: Internal Medicine | Admitting: Physical Therapy

## 2011-09-24 DIAGNOSIS — R5381 Other malaise: Secondary | ICD-10-CM | POA: Insufficient documentation

## 2011-09-24 DIAGNOSIS — M6281 Muscle weakness (generalized): Secondary | ICD-10-CM | POA: Insufficient documentation

## 2011-09-24 DIAGNOSIS — R262 Difficulty in walking, not elsewhere classified: Secondary | ICD-10-CM | POA: Insufficient documentation

## 2011-09-24 DIAGNOSIS — IMO0001 Reserved for inherently not codable concepts without codable children: Secondary | ICD-10-CM | POA: Insufficient documentation

## 2011-09-24 DIAGNOSIS — I69998 Other sequelae following unspecified cerebrovascular disease: Secondary | ICD-10-CM | POA: Insufficient documentation

## 2011-09-26 ENCOUNTER — Ambulatory Visit: Payer: PRIVATE HEALTH INSURANCE | Admitting: Physical Therapy

## 2011-09-30 LAB — CBC
HCT: 39.4
HCT: 42.9
Hemoglobin: 14.7
MCHC: 33.2
MCHC: 33.8
MCV: 91.3
MCV: 92.1
Platelets: 332
Platelets: 344
RDW: 12.6
RDW: 12.7
WBC: 8.4

## 2011-09-30 LAB — URINALYSIS, ROUTINE W REFLEX MICROSCOPIC
Bilirubin Urine: NEGATIVE
Glucose, UA: NEGATIVE
Hgb urine dipstick: NEGATIVE
Specific Gravity, Urine: 1.014
pH: 7.5

## 2011-09-30 LAB — FUNGUS CULTURE W SMEAR: Fungal Smear: NONE SEEN

## 2011-09-30 LAB — TYPE AND SCREEN
ABO/RH(D): A POS
Antibody Screen: NEGATIVE

## 2011-09-30 LAB — BLOOD GAS, ARTERIAL
Acid-Base Excess: 0.5
Bicarbonate: 25 — ABNORMAL HIGH
Bicarbonate: 25.1 — ABNORMAL HIGH
O2 Saturation: 97.1
Patient temperature: 98.6
TCO2: 26.2
TCO2: 26.4
pCO2 arterial: 43.7
pO2, Arterial: 125 — ABNORMAL HIGH
pO2, Arterial: 84.6

## 2011-09-30 LAB — COMPREHENSIVE METABOLIC PANEL
ALT: 22
ALT: 26
AST: 20
AST: 36
Albumin: 3.7
Alkaline Phosphatase: 72
BUN: 7
Calcium: 9
Chloride: 102
Creatinine, Ser: 0.75
GFR calc Af Amer: >60
Potassium: 3.3 — ABNORMAL LOW
Sodium: 139
Sodium: 140
Total Bilirubin: 1
Total Protein: 6.6

## 2011-09-30 LAB — TISSUE CULTURE

## 2011-09-30 LAB — HISTOPLASMA ANTIGEN, URINE: Histoplasma Antigen, urine: <2 {EIA'U}

## 2011-09-30 LAB — AFB CULTURE WITH SMEAR (NOT AT ARMC)
Acid Fast Smear: NONE SEEN
Acid Fast Smear: NONE SEEN

## 2011-09-30 LAB — URINE MICROSCOPIC-ADD ON

## 2011-09-30 LAB — BASIC METABOLIC PANEL
BUN: 4 — ABNORMAL LOW
CO2: 29
Chloride: 103
Glucose, Bld: 98
Potassium: 3.9

## 2011-09-30 LAB — GC/CHLAMYDIA PROBE AMP, URINE: GC Probe Amp, Urine: NEGATIVE

## 2011-09-30 LAB — RPR: RPR Ser Ql: NONREACTIVE

## 2011-09-30 LAB — ANGIOTENSIN CONVERTING ENZYME: Angiotensin-Converting Enzyme: 23 U/L (ref 9–67)

## 2011-09-30 LAB — PROTIME-INR: INR: 0.9

## 2011-09-30 LAB — CRYPTOCOCCAL ANTIGEN: Crypto Ag: NEGATIVE

## 2011-10-01 ENCOUNTER — Ambulatory Visit: Payer: PRIVATE HEALTH INSURANCE | Attending: Internal Medicine | Admitting: Physical Therapy

## 2011-10-01 DIAGNOSIS — R262 Difficulty in walking, not elsewhere classified: Secondary | ICD-10-CM | POA: Insufficient documentation

## 2011-10-01 DIAGNOSIS — M6281 Muscle weakness (generalized): Secondary | ICD-10-CM | POA: Insufficient documentation

## 2011-10-01 DIAGNOSIS — I69998 Other sequelae following unspecified cerebrovascular disease: Secondary | ICD-10-CM | POA: Insufficient documentation

## 2011-10-01 DIAGNOSIS — R5381 Other malaise: Secondary | ICD-10-CM | POA: Insufficient documentation

## 2011-10-01 DIAGNOSIS — IMO0001 Reserved for inherently not codable concepts without codable children: Secondary | ICD-10-CM | POA: Insufficient documentation

## 2011-10-03 ENCOUNTER — Ambulatory Visit: Payer: PRIVATE HEALTH INSURANCE | Admitting: Physical Therapy

## 2011-10-08 ENCOUNTER — Ambulatory Visit: Payer: PRIVATE HEALTH INSURANCE | Admitting: Physical Therapy

## 2011-10-08 NOTE — Consult Note (Signed)
NAME:  [name], [name] NO.:  [id number]  MEDICAL RECORD NO.:  [id number]  LOCATION:  WLED                         FACILITY:  [facility]  PHYSICIAN:  Marlan Palau, M.D.  DATE OF BIRTH:  1971/10/19  DATE OF CONSULTATION:  08/30/2011 DATE OF DISCHARGE:                                CONSULTATION   HISTORY OF PRESENT ILLNESS:  [name] is a 40 year old right- handed black female born on 09-06-1971.  The patient in the past has a history of cocaine and marijuana abuse.  The patient comes into the hospital with onset of a deficit that occurred around 10:00 p.m. on August 29, 2011.  The patient was sitting at home and tried to get up and noted that she had difficulty standing and walking.  The patient noted that her right arm was clumsy and noted a tingly sensation down the right arm and right leg.  The patient denied any symptoms on the face.  The patient has had symptoms persist until today and went to the emergency room this afternoon.  The patient denies any change in her speech or swallowing.  Does note a headache.  Denies any visual field changes.  This patient did sustain 1 fall at home.  The patient had been on low-dose aspirin prior to coming into the hospital.  The patient has no primary care physician.  PAST MEDICAL HISTORY:  Significant for: 1. 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 2. Pulmonary sarcoidosis. 3. Gallbladder resection. 4. Depression. 5. Right arm fracture in the past. 6. History of cocaine abuse. 7. Gastroesophageal reflux disease.  CURRENT MEDICATIONS: 1. Aspirin 81 mg daily. 2. Dexilant 60 mg daily.  HABITS:  The patient smokes 3 cigarettes daily.  Does not drink alcohol. Smokes marijuana.  ALLERGIES:  Patient has allergy to RISPERDAL and NAPROSYN, which cause tongue swelling and REMERON results in milk production.  SOCIAL HISTORY:  This patient is single, lives in [place], [place] area, just moved from  [place].  The patient has 2 daughters. Does not work.  FAMILY MEDICAL HISTORY:  Mother is still living with hypertension. Father died with a history of hypertension, diabetes, cancer.  The patient has 3 brothers and 4 sisters, who are alive and well.  REVIEW OF SYSTEMS:  Notable for no recent fevers or chills.  The patient does note headache.  Denies visual field changes.  Denies problems swallowing.  Denies chest pains, abdominal pain, nausea, vomiting, or palpitations.  No troubles controlling the bowels or bladder.  No blackout episodes.  PHYSICAL EXAMINATION:  VITAL SIGNS:  Blood pressure is 183/99, heart rate 62, respiratory rate 16, and temperature afebrile. GENERAL:  This patient is a minimally obese, black female who is alert and cooperative at the time of examination. HEENT:  Head is atraumatic.  Eyes, pupils are equal, round, react to light. NECK:  Supple.  No carotid bruits noted. RESPIRATORY:  Clear. CARDIOVASCULAR:  Regular rate and rhythm.  No obvious murmurs or rubs noted. EXTREMITIES:  Without significant edema. NEUROLOGIC.  Cranial nerves as above.  Facial symmetry is present. Patient has good sensation of the face to pinprick and soft touch bilaterally.  She has good  strength in facial muscles and muscles with head turning and shoulder shrug bilaterally.  Speech is well enunciated, not aphasic.  Motor test reveals 5/5 strength on all fours.  Good symmetric motor tone is noted throughout.  Sensory testing reveals some decreased pinprick sensation and vibratory sensation in the right arm, right leg greater than left.  There is some drift with the right arm that is minimal.  The patient drifts to the bed with the right leg.  The patient has good finger-nose-finger, heel-to-shin bilaterally.  Gait was not tested.  Deep tendon reflexes are symmetric and normal.  Toes are neutral bilaterally.  LABORATORY VALUES:  Notable for white count of 8.3, hemoglobin of  14.7, hematocrit of 41.8, MCV of 88.7, platelets of 342.  Sodium 139, potassium 3.5, chloride 104, CO2 of 23, glucose of 128, BUN of 6, creatinine 0.56, total bili 0.5, alkaline phosphatase 794, SGOT of 25, SGPT of 29, total protein 7.9, albumin 3.8, calcium 9.3.  MRI of the head is as above.  IMPRESSION:  Left thalamic stroke by MRI with right hemisensory deficit.  NIH Stroke Scale score is 4.  Patient is not a t-PA candidate secondary to duration of symptoms.  Patient is to be transfer to [facility] in the unit 3004 for further evaluation.  We will check an MRI-angiogram of the head and neck, 2-D echocardiogram, and check urine drug screen.  The patient will be placed on Plavix since she was already on aspirin at the time of stroke.  We will get physical and occupational therapy evaluation.  The patient will be followed while in-house.     Marlan Palau, M.D.     CKW/MEDQ  D:  08/30/2011  T:  08/31/2011  Job:  [id number]  cc:   [facility]  Electronically Signed by Thana Farr M.D. on 10/08/2011 06:19:49 PM

## 2011-10-10 ENCOUNTER — Ambulatory Visit: Payer: PRIVATE HEALTH INSURANCE | Admitting: Physical Therapy

## 2011-10-10 NOTE — Discharge Summary (Signed)
[name], [name]              ACCOUNT NO.:  [id number]  MEDICAL RECORD NO.:  [id number]  LOCATION:  3040                         FACILITY:  [facility]  PHYSICIAN:  Rosanna Randy, MDDATE OF BIRTH:  [birth year]  DATE OF ADMISSION:  08/30/2011 DATE OF DISCHARGE:  09/04/2011                              DISCHARGE SUMMARY   DISCHARGE DIAGNOSES: 1. Left thalamus cerebrovascular accident with mild residual deficit     upper and lower extremity. 2. Hypertension. 3. Hyperlipidemia. 4. Gastroesophageal reflux disease. 5. Vaginal Trichomoniasis and yeast infection. 6. Tobacco abuse. 7. 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 8. History of asthma, currently not taking any medications. 9. Remote history of polysubstance abuse including cocaine, the     patient currently claims. 10.History of sarcoidosis. 11.Overweight. 12.Hyperglycemia.  DISCHARGE MEDICATIONS: 1. Tylenol 650 mg by mouth every 4 hours as needed for pain. 2. Norvasc 10 mg 1 tablet by mouth daily. 3. Plavix 75 mg 1 tablet by mouth daily with meals. 4. Gabapentin 100 mg 1 tablet by mouth three times a day. 5. Lisinopril 20 mg 1 tablet by mouth daily. 6. Nicotine patch 14 mg every 24 hours transdermally. 7. Zocor 20 mg 1 tablet by mouth daily. 8. Tramadol 50 mg 1 tablet by mouth every 6 hours as needed for pain     not relieved by acetaminophen. 9. Aspirin 81 mg 1 tablet by mouth daily. 10.Dexilant 60 mg 1 capsule by mouth daily.  DISPOSITION AND FOLLOWUP:  The patient had been discharged in stable improved condition with set up home health physical therapy and occupational therapy to continue providing rehabilitation for the mild residual deficit that she had after the stroke.  The patient is going to follow in 1 to 2 months with Dr. Pearlean Brownie for followup after her stroke and will also follow on September 11, 2011, by Dr. Fleet Contras, primary care physician for  further adjustment on her blood pressure medications to make sure that electrolytes and renal function are stable and to readdress any of her other chronic problems.  The patient had been instructed to follow a low-fat diet, low-sodium diet, and to be compliant with her medications.  She had also receive nicotine patch and have instructed to stop smoking.  The patient is planning to be responsible taking care of her and being compliant with instructions provided.  PROCEDURE PERFORMED DURING THIS HOSPITALIZATION:  The patient had a CT of the head without contrast on August 30, 2011, that demonstrated normal CT of the head without acute abnormality and MRI done on August 30, 2011, demonstrated acute 7-mm nonhemorrhagic infarct involving the left thalamus and extending superiorly.  Also have mild periventricular white matter changes, which are advanced for her age.  There is also circumferential mucosal thickening of the left maxillary sinus.  The patient had on September 03, 2011, transcranial Doppler that was normal for low direction and velocity of all identified vessels of the anterior and posterior circulation with no evidence of stenosis, vasospasm, or occlusion.  On August 31, 2011, she had carotid Dopplers that demonstrated no significant ICA stenosis with vertebral arteries with antegrade flow.  The patient  also had a 2-D echo on September 02, 2011, that demonstrated normal systolic function with moderate left ventricular hypertrophy, ejection fraction 55% to 60%, no wall motion abnormalities.  No other procedures were performed during this hospitalization.  Neurology was consulted during this admission.  Please refer to Neurology consultation note for further details.  HISTORY OF PRESENT ILLNESS:  For full details, refer to Dr. Verl Dicker dictation on August 30, 2011, but briefly this is a 40 year old female with a history of hypertension, bipolar disorder, asthma,  and hyperlipidemia with a poor compliance who was taking aspirin prior to coming into the hospital and presented to the emergency department with a chief complaint of a right-sided upper and lower extremity numbness and weakness that started approximately 12 hours prior to admission. The patient reports that she initially did not seek any medical attention thinking that the symptoms will go away, but instead the symptoms just continued to progressively getting worse, reason why she decided to come to the emergency department for further evaluation and treatment.  At the moment of examination, an MRI was done demonstrating that the patient had an acute/subacute stroke of the left brain, but she was passed the period for any TPA treatment.  At that moment, [facility] was called in order to admit the patient for further evaluation and treatment.  PERTINENT LABORATORY DATA:  CBC with differential on admission with a white blood cells of 8.3, hemoglobin 14.7, platelets 342.  Comprehensive metabolic panel with a sodium of 139, potassium 3.5, chloride 104, bicarb 23, glucose 128, BUN 6, creatinine 0.56, normal LFTs.  Urinary drug screen was positive for marijuana.  Negative urine pregnancy test. PT 13.1 with INR of 0.97, PTT 29.  Urinalysis demonstrated moderate leukocytes, negative nitrites, negative protein, microscopy with 3 to 6 white blood cells, positive Trichomonas specimen identified.  Hemoglobin A1c was 5.0.  Lipid profile demonstrated a total cholesterol of 152, triglycerides 117, HDL 46, LDL 83, TSH 1.326.  Vitamin B12 of 856. Urine culture, no growth, Chlamydia and gonorrhea were negative.  At discharge, CBC demonstrated a white blood cells of 9.7, hemoglobin 14.5, platelets 324.  BMET with a sodium of 139, potassium 4.0, chloride 105, bicarb 25, glucose 93, BUN 10, creatinine 0.53, calcium 9.4.  HOSPITAL COURSE BY PROBLEM: 1. Left thalamus CVA, which was acute and in the  setting of being     already on aspirin.  At this moment, following Neurology     recommendation the patient has been placed on Plavix while continue     using baby aspirin, and is going to stop smoking, have a better     control of her blood pressure, and to be compliant with her statin     medications.  The patient is going to follow with Dr. Pearlean Brownie in 1-2     months and will follow with Dr. Fleet Contras in 1 week for further     adjustment on her medications. 2. Hypertension, now well controlled with the use of Norvasc,     lisinopril.  She will continue taking these medications as     instructed and is going to follow a low-sodium diet and will follow     with primary care physician for further adjustment of her     medications. 3. Gastroesophageal reflux disease.  We are going to continue using     PPI. 4. Hyperglycemia without history of diabetes.  Hemoglobin A1c and 2     more fasting glucose were checked throughout this  hospitalization     demonstrating that the patient is not glucose intolerance or     diabetes at this point.  Further followup as an outpatient is     recommended. 5. History of tobacco abuse.  The patient had been instructed and     counseled regarding smoking cessation and a nicotine patch had been     prescribed.  She will require further support and encouraged to     quit as an outpatient, but at this moment after experiencing a     stroke, she is planning to be more responsible with her care. 6. Vaginal Trichomoniasis and yeast infection that was treated inside     the hospital with 2 g of Flagyl and 3 days of Diflucan.  The     patient's partner had also received a prescription in order to be     treated for Trichomonas as well. 7. Neuropathy after the stroke.  She had been started on Naprosyn 100     mg by mouth three times a day and the plan is to continue taking     this medication.  She will also have physical therapy and     occupational therapy as  an outpatient. 8. History of overweight.  The patient had been instructed to follow a     low-calorie diet and to increase her level of exercise in order to     lose weight.  At the moment of discharge, the rest of her medical problems appears to be stable and no changes have been done to her medication regimen. Further adjustment are going to be made by primary care physician during followup appointment.  PHYSICAL EXAMINATION:  VITAL SIGNS:  At discharge demonstrated a temperature of 98.5, heart rate 60, respiratory rate 18, blood pressure 149/90, oxygen saturation 98% on room air. GENERAL:  She was in no acute distress. RESPIRATORY:  Clear to auscultation bilaterally. HEART:  Regular rate and rhythm.  No murmurs or gallops. ABDOMEN:  Soft, nontender.  Positive bowel sounds.  No distention. EXTREMITIES:  No edema.  No cyanosis or clubbing. NEUROLOGIC:  The patient was alert, awake, and oriented x3.  There is 4/5 right upper extremity deficit and also 4/5 muscle strength in the right lower extremity.  The patient with mild decrease in coordination and a little bit of wobbling ambulation due to decreased perception.  No other focal neurologic deficit was appreciated.     Rosanna Randy, MD     CEM/MEDQ  D:  09/04/2011  T:  09/04/2011  Job:  [id number]  cc:   Pramod P. Pearlean Brownie, MD Fleet Contras, M.D.  Electronically Signed by Vassie Loll MD on 10/10/2011 07:48:47 AM

## 2011-10-14 NOTE — H&P (Signed)
[name], [name]              ACCOUNT NO.:  [id number]  MEDICAL RECORD NO.:  [id number]  LOCATION:  [facility]                         FACILITY:  [facility]  PHYSICIAN:  Mahlani Berninger I Mayvis Agudelo, MD      DATE OF BIRTH:  [birth year]  DATE OF ADMISSION:  08/30/2011 DATE OF DISCHARGE:                             HISTORY & PHYSICAL   PRIMARY CARE PHYSICIAN:  The patient unassigned.  CHIEF COMPLAINT:  Right upper extremity numbness and weakness, started yesterday at 10 p.m.  HISTORY OF PRESENT ILLNESS:  This is a 40 year old female with a history of hypertension, asthma, bipolar disorder, previous history of seizure disorder.  Per the patient, she is off antiplatelet medication for the last 7 years with no recurrence of her seizure, hyperlipidemia, osteoporosis, history of sarcoidosis with no MD followup.  She is currently not on any medication other than baby aspirin.  The patient presented to the ED with chief complaint of right side upper and lower extremity numbness and weakness that started yesterday at 10 p.m.  Per the patient, she started to drag her arm and her leg with some numbness and pinch-like sensation in both upper and lower extremities.  She felt these symptoms could go away, but apparently her symptoms persisted and when she presented to the ED, she passed the period for any TPA. Condition also associated with frontal headache, 8/10, and also associated with 3 episodes of vomiting, but no chest pain, no shortness of breath.  The patient denies any hemoptysis or hematemesis.  The patient denies any neck pain or any neck trauma.  PAST MEDICAL HISTORY: 1. Asthma. 2. Bipolar disorder, hetero. 3. CHF, EF is unknown. 4. Cocaine abuse, but the patient denied. 5. Degenerative disk disease. 6. Depression. 7. Hyperlipidemia. 8. Hypertension. 9. Osteoporosis 10.Sarcoidosis. 11.Seizure disorder in the past. 12.Sleep apnea.  PAST SURGICAL HISTORY: 1. Status post cholecystectomy. 2.  Status post lung biopsy. 3. History of right arm fracture.  ALLERGIES: 1. NAPROSYN. 2. RISPERDAL. 3. SEROQUEL, which caused tongue swelling and skin rash.  FAMILY HISTORY:  Dad has a history of hypertension and stroke, also has multiple myeloma.  Mother has a history of diabetes mellitus and hypertension.  Father already deceased after complication of multiple myeloma.  SOCIAL HISTORY:  She is on disability.  She quit smoking more than 4 years ago.  She smoked marijuana.  Denies any cocaine abuse.  Denies any illicit drug abuse.  She lives with her boyfriend and has 2 grown daughters.  REVIEW OF SYSTEMS:  Currently, the patient complains of headache and denies any blurring of vision.  She is still complaining of right side weakness and numbness, pinch like.  Denies any neck pain.  Denies any chest pain or shortness of breath.  Denies any palpitations.  Complained of some nausea and vomiting, but vomiting stopped by 2:30 p.m.  Denies any burning, micturition.  Denies any hemoptysis or hematemesis.  PHYSICAL EXAMINATION:  GENERAL:  This is a 40 year old female lying on bed, not in respiratory distress or shortness of breath.  She is alert x4, oriented x3 with no facial droop. VITAL SIGNS:  Temperature 97.9, blood pressure 170/95, up to 183/99, pulse rate 62, respiratory rate  16, saturating 100% on room air. NECK:  Supple.  No lymphadenopathy.  No JVD. HEART:  S1 and S2.  There is no audible murmur, gallop, or abnormal rhythm. LUNGS:  Normal vesicular breathing with equal air entry.  No rales.  No rhonchi. ABDOMEN:  Soft, nontender.  Bowel sounds positive. EXTREMITIES:  Without edema, clubbing, or cyanosis. NEUROLOGIC:  Cranial nerves from II through XII seem intact.  Right upper extremity power is 3+/5.  There is pronation drifting of her right side.  No hypertonia.  Babinski sign negative.  Lower extremity power is 3/6 and there is obvious weakness of her left side reflexes.   Brisk, toe nail reflexes are downgoing.  PROCEDURES:  BLOOD WORKUP:  CBC 8.3, hemoglobin 14.7, hematocrit 41.8, platelets 342.  Sodium 139, potassium 3.5, chloride 105, CO2 of 23, glucose 128.  LFTs within normal.  MRI of the brain did show acute-to- subacute 7 mm nonhemorrhagic infarct, involving the left thalamus and extending superiorly.  Mild periventricular white matter disease could be related to multiple sclerosis, vasculitis.  ASSESSMENT AND PLAN: 1. This is a 40 year old female with a history of hypertension and     hyperlipidemia, apparently did not have good followup.  Already on     aspirin, presented with right side weakness. 2. 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  We will get carotid     Dopplers and 2D echo.  MRA and MRI of the brain and neck.  The     patient already on aspirin.  We will start the patient on Plavix 75     mg p.o. daily.  We will get cholesterol level, TSH level,     hemoglobin.  We will get drug screen.  Neuro already consulted.  We     will see the patient at [facility].  We will ask Physical Therapy and     Occupational Therapy to see. 3. Hypertension, still uncontrolled.  Blood pressure around 183/99.     We will place the patient on very small dose of Norvasc 5 mg p.o.     daily.  We will plan not to drop the blood pressure below 150/90. 4. Hyperlipidemia.  We will place the patient on Zocor.  We will offer     the patient education about stroke, hyperlipidemia, hypertension.     We will check urine for pregnancy test and urine for drug screen. 5. Hypokalemia.  We will add potassium chloride to IV fluid.     Keshav Winegar Bosie Helper, MD     HIE/MEDQ  D:  08/30/2011  T:  08/30/2011  Job:  [id number]  Electronically Signed by Ebony Cargo MD on 10/14/2011 04:31:25 PM

## 2011-10-15 ENCOUNTER — Ambulatory Visit: Payer: PRIVATE HEALTH INSURANCE | Admitting: Physical Therapy

## 2011-10-17 ENCOUNTER — Ambulatory Visit: Payer: PRIVATE HEALTH INSURANCE | Admitting: Physical Therapy

## 2011-10-22 ENCOUNTER — Ambulatory Visit: Payer: PRIVATE HEALTH INSURANCE | Admitting: Physical Therapy

## 2011-10-24 ENCOUNTER — Ambulatory Visit: Payer: PRIVATE HEALTH INSURANCE | Admitting: Physical Therapy

## 2011-10-29 ENCOUNTER — Ambulatory Visit: Payer: PRIVATE HEALTH INSURANCE | Admitting: Physical Therapy

## 2011-10-31 ENCOUNTER — Ambulatory Visit: Payer: PRIVATE HEALTH INSURANCE | Attending: Internal Medicine | Admitting: Physical Therapy

## 2011-10-31 DIAGNOSIS — R5381 Other malaise: Secondary | ICD-10-CM | POA: Insufficient documentation

## 2011-10-31 DIAGNOSIS — M6281 Muscle weakness (generalized): Secondary | ICD-10-CM | POA: Insufficient documentation

## 2011-10-31 DIAGNOSIS — IMO0001 Reserved for inherently not codable concepts without codable children: Secondary | ICD-10-CM | POA: Insufficient documentation

## 2011-10-31 DIAGNOSIS — I69998 Other sequelae following unspecified cerebrovascular disease: Secondary | ICD-10-CM | POA: Insufficient documentation

## 2011-10-31 DIAGNOSIS — R262 Difficulty in walking, not elsewhere classified: Secondary | ICD-10-CM | POA: Insufficient documentation

## 2011-11-05 ENCOUNTER — Ambulatory Visit: Payer: PRIVATE HEALTH INSURANCE | Admitting: Physical Therapy

## 2011-11-07 ENCOUNTER — Ambulatory Visit: Payer: PRIVATE HEALTH INSURANCE | Admitting: Physical Therapy

## 2012-01-29 ENCOUNTER — Other Ambulatory Visit: Payer: Self-pay | Admitting: Internal Medicine

## 2012-01-29 DIAGNOSIS — Z780 Asymptomatic menopausal state: Secondary | ICD-10-CM

## 2012-02-06 ENCOUNTER — Ambulatory Visit
Admission: RE | Admit: 2012-02-06 | Discharge: 2012-02-06 | Disposition: A | Discharge status: Home / Self Care | Payer: PRIVATE HEALTH INSURANCE | Source: Ambulatory Visit | Attending: Internal Medicine | Admitting: Internal Medicine

## 2012-02-06 ENCOUNTER — Other Ambulatory Visit: Payer: Self-pay | Admitting: Internal Medicine

## 2012-02-06 DIAGNOSIS — Z1231 Encounter for screening mammogram for malignant neoplasm of breast: Secondary | ICD-10-CM

## 2012-02-06 DIAGNOSIS — Z780 Asymptomatic menopausal state: Secondary | ICD-10-CM

## 2012-02-06 LAB — HM DEXA SCAN

## 2012-02-06 LAB — HM MAMMOGRAPHY

## 2013-03-03 ENCOUNTER — Ambulatory Visit
Admission: RE | Admit: 2013-03-03 | Discharge: 2013-03-03 | Disposition: A | Discharge status: Home / Self Care | Payer: PRIVATE HEALTH INSURANCE | Source: Ambulatory Visit | Attending: Internal Medicine | Admitting: Internal Medicine

## 2013-03-03 ENCOUNTER — Other Ambulatory Visit: Payer: Self-pay | Admitting: Internal Medicine

## 2013-03-03 DIAGNOSIS — M545 Low back pain: Secondary | ICD-10-CM

## 2013-03-03 DIAGNOSIS — G8929 Other chronic pain: Secondary | ICD-10-CM

## 2013-05-06 ENCOUNTER — Other Ambulatory Visit: Payer: Self-pay | Admitting: *Deleted

## 2013-05-06 DIAGNOSIS — I10 Essential (primary) hypertension: Secondary | ICD-10-CM

## 2013-05-06 DIAGNOSIS — E785 Hyperlipidemia, unspecified: Secondary | ICD-10-CM

## 2013-05-23 ENCOUNTER — Other Ambulatory Visit: Payer: Self-pay | Admitting: Internal Medicine

## 2013-06-04 ENCOUNTER — Other Ambulatory Visit: Payer: PRIVATE HEALTH INSURANCE

## 2013-06-04 DIAGNOSIS — E785 Hyperlipidemia, unspecified: Secondary | ICD-10-CM

## 2013-06-04 DIAGNOSIS — I10 Essential (primary) hypertension: Secondary | ICD-10-CM

## 2013-06-05 LAB — COMPREHENSIVE METABOLIC PANEL
ALT: 21 IU/L (ref 0–32)
Albumin/Globulin Ratio: 1.5 (ref 1.1–2.5)
Calcium: 9.7 mg/dL (ref 8.7–10.2)
GFR calc non Af Amer: 101 mL/min/{1.73_m2} (ref >59)
Glucose: 84 mg/dL (ref 65–99)
Potassium: 4.2 mmol/L (ref 3.5–5.2)
Total Protein: 7.1 g/dL (ref 6.0–8.5)

## 2013-06-05 LAB — LIPID PANEL
Chol/HDL Ratio: 2.6 ratio units (ref 0.0–4.4)
HDL: 39 mg/dL — ABNORMAL LOW (ref >39)

## 2013-06-07 ENCOUNTER — Encounter: Payer: Self-pay | Admitting: *Deleted

## 2013-06-08 ENCOUNTER — Ambulatory Visit: Payer: Self-pay | Admitting: Internal Medicine

## 2013-06-08 ENCOUNTER — Encounter: Payer: Self-pay | Admitting: Internal Medicine

## 2013-06-08 ENCOUNTER — Ambulatory Visit (INDEPENDENT_AMBULATORY_CARE_PROVIDER_SITE_OTHER): Payer: PRIVATE HEALTH INSURANCE | Admitting: Internal Medicine

## 2013-06-08 VITALS — BP 122/84 | HR 58 | Temp 98.0°F | Resp 14 | Ht 63.0 in | Wt 177.4 lb

## 2013-06-08 DIAGNOSIS — I6990 Unspecified sequelae of unspecified cerebrovascular disease: Secondary | ICD-10-CM

## 2013-06-08 DIAGNOSIS — G894 Chronic pain syndrome: Secondary | ICD-10-CM | POA: Insufficient documentation

## 2013-06-08 DIAGNOSIS — G629 Polyneuropathy, unspecified: Secondary | ICD-10-CM | POA: Insufficient documentation

## 2013-06-08 DIAGNOSIS — E785 Hyperlipidemia, unspecified: Secondary | ICD-10-CM | POA: Insufficient documentation

## 2013-06-08 DIAGNOSIS — G8929 Other chronic pain: Secondary | ICD-10-CM

## 2013-06-08 DIAGNOSIS — K219 Gastro-esophageal reflux disease without esophagitis: Secondary | ICD-10-CM

## 2013-06-08 DIAGNOSIS — M545 Low back pain: Secondary | ICD-10-CM

## 2013-06-08 DIAGNOSIS — K5900 Constipation, unspecified: Secondary | ICD-10-CM | POA: Insufficient documentation

## 2013-06-08 DIAGNOSIS — I10 Essential (primary) hypertension: Secondary | ICD-10-CM | POA: Insufficient documentation

## 2013-06-08 DIAGNOSIS — G4700 Insomnia, unspecified: Secondary | ICD-10-CM

## 2013-06-08 MED ORDER — ZOLPIDEM TARTRATE 5 MG PO TABS: 5.0000 mg | ORAL_TABLET | Freq: Every evening | ORAL | Status: DC | PRN

## 2013-06-08 NOTE — Progress Notes (Signed)
Subjective:    Patient ID: [name], female    DOB: 06-13-1971, 42 y.o.   MRN: [id number]  HPI  She is here for routine visit. She is unable to sleep for almost 3 months now. Is not taking nap in the afternoon. Is lying in bed at around 10 pm and falls asleep by 11 pm. Does not have her Television on or computer on while trying to sleep. She puts the lights off. She is not taking her dinner after 7 pm and no heavy exercise or caffeine before going to bed. bp well controlled Has been regular in her gym and has lost 10 lbs intentionally Back pain has improved Has regular bowel movement and amitiza has been very helpful Reflux under control Patient doing well overall from last visit Labs reviewed. Cholesterol level is also improved  Review of Systems  Constitutional: Negative for fever, chills, diaphoresis and appetite change.  HENT: Negative for mouth sores, neck pain and sinus pressure.   Eyes: Negative for visual disturbance.  Respiratory: Negative for cough and shortness of breath.   Cardiovascular: Negative for chest pain, palpitations and leg swelling.  Gastrointestinal: Negative for abdominal pain and blood in stool.  Genitourinary: Negative for dysuria, flank pain and vaginal discharge.  Musculoskeletal: Positive for back pain.  Neurological: Negative for dizziness and light-headedness.  Hematological: Negative for adenopathy.  Psychiatric/Behavioral: Negative for behavioral problems and agitation.       Objective:   Physical Exam  Constitutional: She is oriented to person, place, and time. She appears well-developed. No distress.  obese  HENT:  Head: Normocephalic.  Mouth/Throat: Oropharynx is clear and moist.  Eyes: Conjunctivae and EOM are normal. Pupils are equal, round, and reactive to light.  Neck: Normal range of motion. Neck supple. No JVD present.  Cardiovascular: Normal rate, regular rhythm and normal heart sounds.   Pulmonary/Chest: Effort normal and  breath sounds normal. She has no wheezes. She has no rales. She exhibits no tenderness.  Abdominal: Soft. Bowel sounds are normal. She exhibits no distension and no mass. There is no guarding.  Musculoskeletal: Normal range of motion. She exhibits no edema and no tenderness.  Lymphadenopathy:    She has no cervical adenopathy.  Neurological: She is alert and oriented to person, place, and time. She has normal reflexes.  Skin: Skin is warm and dry. She is not diaphoretic.  Psychiatric: She has a normal mood and affect. Her behavior is normal.    BP 122/84  Pulse 58  Temp(Src) 98 F (36.7 C) (Oral)  Resp 14  Ht 5\' 3"  (1.6 m)  Wt 177 lb 6.4 oz (80.468 kg)  BMI 31.43 kg/m2   Labs reviewed-  01/29/2012 TSH 0.744, Vit D 15.1 06/23/2012 CBC; WBC 8.4, RBC 4.79, HGB 14.6 CMP; Glucose 94, BUN 7, Creatinine 0.57 A1c 5.2 Lipid Panel; Cholesterol 111, Triglycerides 58, HDL 51, LDL 48 Vit D 38.5 4/0/98 wbc 8.4, hb 14.5, plt 456, glu 94, cr 0.57, lft normal except alt 42, a1c 5.2, normal cholesterol, vit d 38.5 11/10/12 CMP; Glucose 84, BUN 14, Creatinine 1.11 12/02/2012  BMP: glucose 92, BUN 10, Creatinine 0.72 CBC    Component Value Date/Time   WBC 9.7 09/02/2011 0605   RBC 4.66 09/02/2011 0605   HGB 14.5 09/02/2011 0605   HCT 41.3 09/02/2011 0605   PLT 324 09/02/2011 0605   MCV 88.6 09/02/2011 0605   MCH 31.1 09/02/2011 0605   MCHC 35.1 09/02/2011 0605   RDW 11.7 09/02/2011 0605  LYMPHSABS 2.4 08/30/2011 1410   MONOABS 0.6 08/30/2011 1410   EOSABS 0.5 08/30/2011 1410   BASOSABS 0.1 08/30/2011 1410    CMP     Component Value Date/Time   NA 141 06/04/2013 0858   NA 139 09/02/2011 0605   K 4.2 06/04/2013 0858   CL 104 06/04/2013 0858   CO2 24 06/04/2013 0858   GLUCOSE 84 06/04/2013 0858   GLUCOSE 93 09/02/2011 0605   BUN 8 06/04/2013 0858   BUN 10 09/02/2011 0605   CREATININE 0.73 06/04/2013 0858   CALCIUM 9.7 06/04/2013 0858   PROT 7.1 06/04/2013 0858   PROT 7.9 08/30/2011 1410   ALBUMIN 3.8 08/30/2011 1410   AST  18 06/04/2013 0858   ALT 21 06/04/2013 0858   ALKPHOS 79 06/04/2013 0858   BILITOT 1.6* 06/04/2013 0858   GFRNONAA 101 06/04/2013 0858   GFRAA 117 06/04/2013 0858   Lipid Panel     Component Value Date/Time   CHOL 152 08/31/2011 0600   TRIG 56 06/04/2013 0858   HDL 39* 06/04/2013 0858   HDL 46 08/31/2011 0600   CHOLHDL 2.6 06/04/2013 0858   CHOLHDL 3.3 08/31/2011 0600   VLDL 23 08/31/2011 0600   LDLCALC 52 06/04/2013 0858   LDLCALC 83 08/31/2011 0600       Assessment & Plan:   Insomnia- will introduce ambien 5 mg qhs prn for now and reassess. Continue sleep hygiene  constipation- improved, continue amitiza  Old cva- continue asa and plavix, no residual weakness now.bp controlled. Continue statin and bp meds  gerd- tolerating dexilant well and has helped with her symptoms  Chronic back pain- continue neurontin and tramadol  for now and monitor  htn- bp well controlled. Continue lisinopril

## 2013-06-16 ENCOUNTER — Ambulatory Visit: Payer: Self-pay | Admitting: Internal Medicine

## 2013-06-18 ENCOUNTER — Other Ambulatory Visit: Payer: Self-pay | Admitting: Internal Medicine

## 2013-08-23 ENCOUNTER — Other Ambulatory Visit: Payer: Self-pay | Admitting: Internal Medicine

## 2013-09-08 ENCOUNTER — Encounter: Payer: Self-pay | Admitting: Internal Medicine

## 2013-09-08 ENCOUNTER — Ambulatory Visit (INDEPENDENT_AMBULATORY_CARE_PROVIDER_SITE_OTHER): Payer: PRIVATE HEALTH INSURANCE | Admitting: Internal Medicine

## 2013-09-08 VITALS — BP 128/78 | HR 50 | Temp 97.0°F | Ht 63.08 in | Wt 158.0 lb

## 2013-09-08 DIAGNOSIS — I10 Essential (primary) hypertension: Secondary | ICD-10-CM

## 2013-09-08 DIAGNOSIS — I6990 Unspecified sequelae of unspecified cerebrovascular disease: Secondary | ICD-10-CM

## 2013-09-08 DIAGNOSIS — M545 Low back pain, unspecified: Secondary | ICD-10-CM

## 2013-09-08 DIAGNOSIS — G609 Hereditary and idiopathic neuropathy, unspecified: Secondary | ICD-10-CM

## 2013-09-08 DIAGNOSIS — G8929 Other chronic pain: Secondary | ICD-10-CM

## 2013-09-08 DIAGNOSIS — G629 Polyneuropathy, unspecified: Secondary | ICD-10-CM

## 2013-09-08 DIAGNOSIS — Z23 Encounter for immunization: Secondary | ICD-10-CM

## 2013-09-08 DIAGNOSIS — B360 Pityriasis versicolor: Secondary | ICD-10-CM

## 2013-09-08 DIAGNOSIS — K219 Gastro-esophageal reflux disease without esophagitis: Secondary | ICD-10-CM

## 2013-09-08 DIAGNOSIS — G4700 Insomnia, unspecified: Secondary | ICD-10-CM

## 2013-09-08 DIAGNOSIS — E785 Hyperlipidemia, unspecified: Secondary | ICD-10-CM

## 2013-09-08 DIAGNOSIS — K5900 Constipation, unspecified: Secondary | ICD-10-CM

## 2013-09-08 MED ORDER — ZOLPIDEM TARTRATE 5 MG PO TABS: 5.0000 mg | ORAL_TABLET | Freq: Every evening | ORAL | Status: DC | PRN

## 2013-09-08 MED ORDER — LISINOPRIL 20 MG PO TABS: ORAL_TABLET | ORAL | Status: DC

## 2013-09-08 MED ORDER — SIMVASTATIN 20 MG PO TABS: ORAL_TABLET | ORAL | Status: DC

## 2013-09-08 MED ORDER — FLUCONAZOLE 200 MG PO TABS: 200.0000 mg | ORAL_TABLET | ORAL | Status: DC

## 2013-09-08 MED ORDER — KETOCONAZOLE-HYDROCORTISONE 2 & 1 % (CREAM) EX KIT: 1.0000 "application " | PACK | Freq: Every day | CUTANEOUS | Status: DC

## 2013-09-08 MED ORDER — TRAMADOL HCL 50 MG PO TABS: ORAL_TABLET | ORAL | Status: DC

## 2013-09-08 MED ORDER — AMLODIPINE BESYLATE 5 MG PO TABS: ORAL_TABLET | ORAL | Status: DC

## 2013-09-08 MED ORDER — CLOPIDOGREL BISULFATE 75 MG PO TABS: ORAL_TABLET | ORAL | Status: DC

## 2013-09-08 MED ORDER — DEXLANSOPRAZOLE 60 MG PO CPDR: DELAYED_RELEASE_CAPSULE | ORAL | Status: DC

## 2013-09-08 MED ORDER — LUBIPROSTONE 8 MCG PO CAPS: ORAL_CAPSULE | ORAL | Status: DC

## 2013-09-08 MED ORDER — GABAPENTIN 100 MG PO CAPS: ORAL_CAPSULE | ORAL | Status: DC

## 2013-09-08 NOTE — Progress Notes (Signed)
Patient ID: [name], female   DOB: 02/17/1971, 42 y.o.   MRN: [id number]  Chief Complaint  Patient presents with  . Medical Managment of Chronic Issues    3 Month follow-up   . Medication Refill    Renew all medications to new pharmacy   . Skin Problem    Examine back- meciation for skin condition     HPI 42 y/o female patient is here for routine follow up  She has noted skin lesions on and off for few months now, mainly in her back and trunk with some in her hands as well. It self resolves but this time, it came in as red,patchy rash, it is associated with itching. Sweat and sun aggrevates the itching. She has been using sunscreen. She mentions the redness to have gone but now appears like dark and light pattern on her skin.  Rest of symptoms under control. She has a new dog now and goes for walk with it  Has regular bowel movement now. Tolerating amitiza well  Reflux symptom under control  Has been exercising on regular basis. Has lost weight.  Careful with her dietary intake  Sleeping well at night   Review of Systems  Constitutional: Negative for fever, chills, diaphoresis and appetite change.  HENT: Negative for mouth sores, neck pain and sinus pressure.   Eyes: Negative for visual disturbance.  Respiratory: Negative for cough and shortness of breath.   Cardiovascular: Negative for chest pain, palpitations and leg swelling.  Gastrointestinal: Negative for abdominal pain and blood in stool.  Genitourinary: Negative for dysuria, flank pain and vaginal discharge.  Musculoskeletal: Positive for back pain.  Neurological: Negative for dizziness and light-headedness.  Hematological: Negative for adenopathy.  Psychiatric/Behavioral: Negative for behavioral problems and agitation.    Allergies  Allergen Reactions  . Naprosyn [Naproxen]   . Risperdal [Risperidone]   . Seroquel [Quetiapine Fumarate]     Past Medical History  Diagnosis Date  . Bipolar disorder    . CHF (congestive heart failure)   . Hypertension   . Seizure disorder   . Unspecified constipation   . Unspecified vitamin D deficiency   . Unspecified late effects of cerebrovascular disease   . Allergic rhinitis due to pollen   . Other malaise and fatigue   . Sarcoidosis   . Other and unspecified hyperlipidemia   . Osteoporosis, unspecified   . Cocaine dependence 2012  . Other and unspecified hyperlipidemia   . Other convulsions   . Other abnormal blood chemistry   . Unspecified sleep apnea     BP 128/78  Pulse 50  Temp(Src) 97 F (36.1 C) (Oral)  Ht 5' 3.08" (1.602 m)  Wt 158 lb (71.668 kg)  BMI 27.93 kg/m2  SpO2 98%  Physical Exam  Constitutional: She is oriented to person, place, and time. She appears well-developed. No distress. Has lost weight HENT:  Head: Normocephalic.   Mouth/Throat: Oropharynx is clear and moist.  Eyes: Conjunctivae and EOM are normal. Pupils are equal, round, and reactive to light.  Neck: Normal range of motion. Neck supple. No JVD present.  Cardiovascular: Normal rate, regular rhythm and normal heart sounds.   Pulmonary/Chest: Effort normal and breath sounds normal. She has no wheezes. She has no rales. She exhibits no tenderness.  Abdominal: Soft. Bowel sounds are normal. She exhibits no distension and no mass. There is no guarding.  Musculoskeletal: Normal range of motion. She exhibits no edema and no tenderness.  Lymphadenopathy:    She  has no cervical adenopathy.  Neurological: She is alert and oriented to person, place, and time. She has normal reflexes.  Skin: Skin is warm and dry. She is not diaphoretic. Has hypopigmented areas followed by hyperpigmented areas on her back and trunk and chest area. Some light ones on both her arms as well. It fits the book description of tinea versicolor Psychiatric: She has a normal mood and affect. Her behavior is normal.    Labs reviewed  01/29/2012 TSH 0.744, Vit D 15.1 06/23/2012 CBC; WBC 8.4,  RBC 4.79, HGB 14.6 CMP; Glucose 94, BUN 7, Creatinine 0.57 A1c 5.2 Lipid Panel; Cholesterol 111, Triglycerides 58, HDL 51, LDL 48 Vit D 38.5 07/06/28 wbc 8.4, hb 14.5, plt 456, glu 94, cr 0.57, lft normal except alt 42, a1c 5.2, normal cholesterol, vit d 38.5 11/10/12 CMP; Glucose 84, BUN 14, Creatinine 1.11 12/02/2012  BMP: glucose 92, BUN 10, Creatinine 0.72  CMP     Component Value Date/Time   NA 141 06/04/2013 0858   NA 139 09/02/2011 0605   K 4.2 06/04/2013 0858   CL 104 06/04/2013 0858   CO2 24 06/04/2013 0858   GLUCOSE 84 06/04/2013 0858   GLUCOSE 93 09/02/2011 0605   BUN 8 06/04/2013 0858   BUN 10 09/02/2011 0605   CREATININE 0.73 06/04/2013 0858   CALCIUM 9.7 06/04/2013 0858   PROT 7.1 06/04/2013 0858   PROT 7.9 08/30/2011 1410   ALBUMIN 3.8 08/30/2011 1410   AST 18 06/04/2013 0858   ALT 21 06/04/2013 0858   ALKPHOS 79 06/04/2013 0858   BILITOT 1.6* 06/04/2013 0858   GFRNONAA 101 06/04/2013 0858   GFRAA 117 06/04/2013 0858    Assessment/plan  Tinea versicolor- with the new skin finding, will treat her with fluconazole 200 mg once a week for 2 weeks. Will also provide ketoconazole cream 2% with steroid 1% to be applied daily for 2 weeks to help with her itching and discomfort as well. Reassess in 2 weeks  Insomnia- tolerating ambien well. Monitor  constipation- improved, continue amitiza  gerd- tolerating dexilant well and has helped with her symptoms. Weight loss has provided better symptom control  Chronic back pain- continue neurontin and tramadol  for now and monitor  Neuropathic pain- continue gabapentin  Old cva- continue asa and plavix, no residual weakness now.bp controlled. Continue statin and bp meds  htn- bp well controlled. Continue lisinopril  Influenza vaccine provided

## 2013-09-16 ENCOUNTER — Other Ambulatory Visit: Payer: Self-pay | Admitting: *Deleted

## 2013-09-16 MED ORDER — KETOCONAZOLE-HYDROCORTISONE 2 & 1 % (CREAM) EX KIT: 1.0000 "application " | PACK | Freq: Every day | CUTANEOUS | Status: DC

## 2013-09-21 ENCOUNTER — Encounter: Payer: Self-pay | Admitting: Internal Medicine

## 2013-09-21 ENCOUNTER — Ambulatory Visit (INDEPENDENT_AMBULATORY_CARE_PROVIDER_SITE_OTHER): Payer: PRIVATE HEALTH INSURANCE | Admitting: Internal Medicine

## 2013-09-21 VITALS — BP 130/80 | HR 56 | Temp 97.8°F | Resp 18 | Ht 63.0 in | Wt 159.4 lb

## 2013-09-21 DIAGNOSIS — B360 Pityriasis versicolor: Secondary | ICD-10-CM

## 2013-09-21 DIAGNOSIS — I10 Essential (primary) hypertension: Secondary | ICD-10-CM

## 2013-09-21 MED ORDER — KETOCONAZOLE-HYDROCORTISONE 2 & 1 % (CREAM) EX KIT: 1.0000 "application " | PACK | Freq: Every day | CUTANEOUS | Status: DC

## 2013-09-21 NOTE — Progress Notes (Signed)
Patient ID: [name], female   DOB: June 14, 1971, 42 y.o.   MRN: [id number]  Chief Complaint  Patient presents with  . Follow-up    skin rash    HPI 42 y/o female patient is here for follow up on her rash. She was started on treatment for tinea versicolor with fluconazole and ketoconazole cream 2 weeks back. She took the fluconazole but was unable to get ketoconazole from her pharmacy as they had some problem with there fax machine and was unable to get the faxed prescription.  Her rash has somewhat improved but still feels itchy and now is dried up. No new areas of rash  Rest of symptoms under control. Tolerating amitiza well  Review of Systems  Constitutional: Negative for fever, chills, diaphoresis and appetite change.   HENT: Negative for mouth sores, neck pain and sinus pressure.    Respiratory: Negative for cough and shortness of breath.    Cardiovascular: Negative for chest pain, palpitations and leg swelling.   Gastrointestinal: Negative for abdominal pain and blood in stool.   Genitourinary: Negative for dysuria, flank pain and vaginal discharge.    Psychiatric/Behavioral: Negative for behavioral problems and agitation.   Past Medical History  Diagnosis Date  . Bipolar disorder   . CHF (congestive heart failure)   . Hypertension   . Seizure disorder   . Unspecified constipation   . Unspecified vitamin D deficiency   . Unspecified late effects of cerebrovascular disease   . Allergic rhinitis due to pollen   . Other malaise and fatigue   . Sarcoidosis   . Other and unspecified hyperlipidemia   . Osteoporosis, unspecified   . Cocaine dependence 2012  . Other and unspecified hyperlipidemia   . Other convulsions   . Other abnormal blood chemistry   . Unspecified sleep apnea    Medication reviewed  Physical Exam   BP 130/80  Pulse 56  Temp(Src) 97.8 F (36.6 C) (Oral)  Resp 18  Ht 5\' 3"  (1.6 m)  Wt 159 lb 6.4 oz (72.303 kg)  BMI 28.24 kg/m2  SpO2  97%  Constitutional: She is oriented to person, place, and time. She appears well-developed. No distress. Has lost weight Neck: Normal range of motion. Neck supple. No JVD present.   Cardiovascular: Normal rate, regular rhythm and normal heart sounds.    Pulmonary/Chest: Effort normal and breath sounds normal. She has no wheezes. She has no rales. She exhibits no tenderness.   Abdominal: Soft. Bowel sounds are normal. She exhibits no distension and no mass. There is no guarding.  Musculoskeletal: Normal range of motion. She exhibits no edema and no tenderness.  Lymphadenopathy:    She has no cervical adenopathy.  Skin: Skin is warm and dry. She is not diaphoretic. Has hypopigmented areas followed by hyperpigmented areas on her back and trunk and chest area. The one in chest area is somewhat resolved since last visit. Psychiatric: She has a normal mood and affect. Her behavior is normal.    Assessment/plan  Tinea versicolor-  Slightly improved. Will rewrite ketoconazole cream 2% with steroid 1% to be applied daily for 2 weeks to help with her itching and discomfort for now. Reassess if no improvement in a week. Use of sunscreen encouraged  Hypertension- bp stable. Continue current regimen

## 2013-10-12 ENCOUNTER — Other Ambulatory Visit: Payer: Self-pay | Admitting: Internal Medicine

## 2013-10-12 NOTE — Telephone Encounter (Signed)
Dr.Pandey please advise on refill request, Spoke with patient, patient stated medication that was sent in on 09/21/2013 was 400 dollars and she is unable to afford that. Patient would like this as na alternative.  Please advise

## 2013-10-12 NOTE — Telephone Encounter (Signed)
Due to cost issue for fluconazole, please provide script for ketoconazole 200 mg daily for 5 days. Check lft a week after completion of treatment

## 2013-10-13 ENCOUNTER — Other Ambulatory Visit: Payer: Self-pay

## 2013-10-13 MED ORDER — KETOCONAZOLE 200 MG PO TABS: 200.0000 mg | ORAL_TABLET | Freq: Every day | ORAL | Status: DC

## 2013-10-13 NOTE — Telephone Encounter (Signed)
Per Dr.Pandey send in rx for Ketoconazole 200 mg daily x 5 days

## 2013-12-07 ENCOUNTER — Ambulatory Visit: Payer: PRIVATE HEALTH INSURANCE | Admitting: Internal Medicine

## 2014-02-14 ENCOUNTER — Other Ambulatory Visit: Payer: Self-pay | Admitting: *Deleted

## 2014-04-05 ENCOUNTER — Encounter: Payer: Self-pay | Admitting: *Deleted

## 2014-04-05 ENCOUNTER — Ambulatory Visit (INDEPENDENT_AMBULATORY_CARE_PROVIDER_SITE_OTHER): Payer: PRIVATE HEALTH INSURANCE | Admitting: Internal Medicine

## 2014-04-05 ENCOUNTER — Encounter: Payer: Self-pay | Admitting: Internal Medicine

## 2014-04-05 VITALS — BP 134/70 | HR 74 | Temp 98.1°F | Wt 152.8 lb

## 2014-04-05 DIAGNOSIS — G8929 Other chronic pain: Secondary | ICD-10-CM

## 2014-04-05 DIAGNOSIS — Z23 Encounter for immunization: Secondary | ICD-10-CM

## 2014-04-05 DIAGNOSIS — B360 Pityriasis versicolor: Secondary | ICD-10-CM

## 2014-04-05 DIAGNOSIS — K5900 Constipation, unspecified: Secondary | ICD-10-CM

## 2014-04-05 DIAGNOSIS — M545 Low back pain, unspecified: Secondary | ICD-10-CM

## 2014-04-05 DIAGNOSIS — I6990 Unspecified sequelae of unspecified cerebrovascular disease: Secondary | ICD-10-CM

## 2014-04-05 DIAGNOSIS — G629 Polyneuropathy, unspecified: Secondary | ICD-10-CM

## 2014-04-05 DIAGNOSIS — J449 Chronic obstructive pulmonary disease, unspecified: Secondary | ICD-10-CM | POA: Insufficient documentation

## 2014-04-05 DIAGNOSIS — I509 Heart failure, unspecified: Secondary | ICD-10-CM | POA: Insufficient documentation

## 2014-04-05 DIAGNOSIS — G609 Hereditary and idiopathic neuropathy, unspecified: Secondary | ICD-10-CM

## 2014-04-05 DIAGNOSIS — E785 Hyperlipidemia, unspecified: Secondary | ICD-10-CM

## 2014-04-05 DIAGNOSIS — I10 Essential (primary) hypertension: Secondary | ICD-10-CM

## 2014-04-05 DIAGNOSIS — M6281 Muscle weakness (generalized): Secondary | ICD-10-CM | POA: Insufficient documentation

## 2014-04-05 DIAGNOSIS — K219 Gastro-esophageal reflux disease without esophagitis: Secondary | ICD-10-CM

## 2014-04-05 MED ORDER — ASPIRIN 81 MG PO TABS: 81.0000 mg | ORAL_TABLET | Freq: Every day | ORAL | Status: AC

## 2014-04-05 MED ORDER — DEXLANSOPRAZOLE 60 MG PO CPDR: DELAYED_RELEASE_CAPSULE | ORAL | Status: DC

## 2014-04-05 MED ORDER — FLUCONAZOLE 150 MG PO TABS: 300.0000 mg | ORAL_TABLET | ORAL | Status: DC

## 2014-04-05 MED ORDER — LISINOPRIL 20 MG PO TABS: ORAL_TABLET | ORAL | Status: DC

## 2014-04-05 MED ORDER — AMLODIPINE BESYLATE 5 MG PO TABS: ORAL_TABLET | ORAL | Status: DC

## 2014-04-05 MED ORDER — ZOLPIDEM TARTRATE 5 MG PO TABS: 5.0000 mg | ORAL_TABLET | Freq: Every evening | ORAL | Status: DC | PRN

## 2014-04-05 MED ORDER — CLOPIDOGREL BISULFATE 75 MG PO TABS: ORAL_TABLET | ORAL | Status: DC

## 2014-04-05 MED ORDER — TETANUS-DIPHTH-ACELL PERTUSSIS 5-2-15.5 LF-MCG/0.5 IM SUSP: 0.5000 mL | Freq: Once | INTRAMUSCULAR | Status: DC

## 2014-04-05 MED ORDER — LUBIPROSTONE 8 MCG PO CAPS: ORAL_CAPSULE | ORAL | Status: DC

## 2014-04-05 MED ORDER — SIMVASTATIN 20 MG PO TABS: ORAL_TABLET | ORAL | Status: DC

## 2014-04-05 MED ORDER — TRAMADOL HCL 50 MG PO TABS
ORAL_TABLET | ORAL | Status: DC
Start: 2014-04-05 — End: 2014-09-15

## 2014-04-05 MED ORDER — GABAPENTIN 100 MG PO CAPS: ORAL_CAPSULE | ORAL | Status: DC

## 2014-04-05 NOTE — Progress Notes (Signed)
Patient ID: [name], female   DOB: December 24, 1971, 43 y.o.   MRN: [id number]    Chief Complaint  Patient presents with  . Medical Managment of Chronic Issues    5 month f/u & insruance form ([employer])  . other    right arm weakness worsened, needs personal care servics form filled out   Allergies  Allergen Reactions  . Naprosyn [Naproxen]   . Risperdal [Risperidone]   . Seroquel [Quetiapine Fumarate]    HPI 43 y/o female patient is here for routine visit. She has hx of cva, htn, copd, tobacco use among others.She has lost 7 lbs since last visit She has stopped smoking for 3 months now. She does have some cough with clear mucus. Denies dyspnea with rest and exertion but has shortness of breath while lying down she has to sit up and this makes her breathing better She used to be on breathing treatment several years back which was stopped by her prior primary care doctor and she has not required any after that. She feels good and denies any symptoms of depression. Her mood has been stable dexa scan 2013 is normal. No osteopenia or osteoporosis noted No echocardiogram in system for review Improved bowel movement  Review of Systems  Constitutional: Negative for fever, chills, malaise/fatigue and diaphoresis.  HENT: Negative for congestion, hearing loss and sore throat.   Eyes: Negative for blurred vision, double vision and discharge.  Respiratory: Negative for wheezing.   Cardiovascular: Negative for chest pain, palpitations, orthopnea and leg swelling.  Gastrointestinal: Negative for heartburn, nausea, vomiting, abdominal pain, diarrhea.  Genitourinary: Negative for dysuria, urgency, frequency and flank pain.  Musculoskeletal: Negative for falls. Has weakness of her right arm limiting her activities of daily living now. Her friend brings her to office visit now Skin: has hypopigmented skin lesion with itching on her chest and back area Neurological: Positive for weakness of right arm.  Negative for dizziness, tingling, focal weakness and headaches.  Psychiatric/Behavioral: Negative for depression and memory loss. The patient is not nervous/anxious.    Past Medical History  Diagnosis Date  . Bipolar disorder   . CHF (congestive heart failure)   . Hypertension   . Seizure disorder   . Unspecified constipation   . Unspecified vitamin D deficiency   . Unspecified late effects of cerebrovascular disease   . Allergic rhinitis due to pollen   . Other malaise and fatigue   . Sarcoidosis   . Other and unspecified hyperlipidemia   . Osteoporosis, unspecified   . Cocaine dependence 2012  . Other and unspecified hyperlipidemia   . Other convulsions   . Other abnormal blood chemistry   . Unspecified sleep apnea   . Obesity, unspecified   . Tobacco use disorder     stopped smoking  . Depressive disorder, not elsewhere classified   . Unspecified hereditary and idiopathic peripheral neuropathy   . Reflux esophagitis   . Allergic rhinitis due to pollen    Past Surgical History  Procedure Laterality Date  . Cholecystectomy    . Lung biopsy    . Fracture surgery      right arm   No current outpatient prescriptions on file prior to visit.   No current facility-administered medications on file prior to visit.    Family History  Problem Relation Age of Onset  . Diabetes Mother   . Hypertension Mother   . Hypertension Father   . Stroke Father   . Cancer Paternal Uncle  breast   Physical exam BP 134/70  Pulse 74  Temp(Src) 98.1 F (36.7 C) (Oral)  Wt 152 lb 12.8 oz (69.31 kg)  Constitutional: She is oriented to person, place, and time. She appears well-developed. No distress. Has lost weight Neck: Normal range of motion. Neck supple. No JVD present.   Cardiovascular: Normal rate, regular rhythm and normal heart sounds.    Pulmonary/Chest: Effort normal and breath sounds normal. She has no wheezes. She has no rales. She exhibits no tenderness.   Abdominal:  Soft. Bowel sounds are normal. She exhibits no distension and no mass. There is no guarding.  Musculoskeletal: strength 4/5 in right arm and hand. No tenderness. Normal ROM. Normal range of motion in other extremities. She exhibits no edema and no tenderness.  Lymphadenopathy: She has no cervical adenopathy.   Skin: Skin is warm and dry. She is not diaphoretic. Has hypopigmented areas followed by hyperpigmented areas on her back and trunk and chest area.  Psychiatric: She has a normal mood and affect. Her behavior is normal.    Assessment/plan  1. Need for prophylactic vaccination with combined diphtheria-tetanus-pertussis (DTP) vaccine - Tdap (ADACEL) 04-30-14.5 LF-MCG/0.5 injection; Inject 0.5 mLs into the muscle once.  Dispense: 0.5 mL; Refill: 0  2. Essential hypertension, benign Stable bp readings at present. Continue amlodipine 5 mg daily, zestril 20 mg daily and zocor for now  3. GERD (gastroesophageal reflux disease) Continue dexilant for now. Monitor symptoms  4. Unspecified constipation Stable with amitiza for now  5. Peripheral neuropathy Continue gabapentin, no dose changes made  6. Tinea versicolor Will have her started on fluconazole 300 mg once a week for 2 weeks and reassess. If no improvement, will need to refer to dermatology  7. Hyperlipidemia LDL goal < 130 Continue zocor 20 mg daily for now - Lipid Panel - CMP - Lipid Panel  8. Chronic low back pain Adjusted tramadol dose to 1-2 tab 50 mg q6h prn for pain for now.  9. COPD (chronic obstructive pulmonary disease) Stopped smoking, congratulated on this. Will get cxr to assess for emphysematous changes given her cough. Will need to consider formal PFT if changes noted - DG Chest 2 View; Future  10. CHF (congestive heart failure) No echocardiogram for review. Given new onset orthopnea, will get echocardiogram to assess on systolic function.  - CBC with Differential - 2D Echocardiogram without contrast;  Future  11. Late effects of CVA (cerebrovascular accident) Has right sideded arm weakness post old cva.Continue aspirin and plavix for now. Monitor bp, continue statin. Will fill out form for personal care services given her arm weakness  12. Muscle weakness of right arm Assess thyroid function and vit d level. Patient wants increased PCS and will fill out form to help evaluate for her home need - Vitamin D, 1,25-dihydroxy - TSH

## 2014-04-07 LAB — COMPREHENSIVE METABOLIC PANEL
A/G RATIO: 1.5 (ref 1.1–2.5)
ALBUMIN: 4.3 g/dL (ref 3.5–5.5)
ALT: 6 IU/L (ref 0–32)
AST: 11 IU/L (ref 0–40)
Alkaline Phosphatase: 59 IU/L (ref 39–117)
BILIRUBIN TOTAL: 0.8 mg/dL (ref 0.0–1.2)
BUN/Creatinine Ratio: 14 (ref 9–23)
BUN: 11 mg/dL (ref 6–24)
CALCIUM: 9.4 mg/dL (ref 8.7–10.2)
CO2: 23 mmol/L (ref 18–29)
CREATININE: 0.8 mg/dL (ref 0.57–1.00)
Chloride: 101 mmol/L (ref 97–108)
GFR calc Af Amer: 104 mL/min/{1.73_m2} (ref >59)
GFR, EST NON AFRICAN AMERICAN: 90 mL/min/{1.73_m2} (ref >59)
GLOBULIN, TOTAL: 2.9 g/dL (ref 1.5–4.5)
GLUCOSE: 76 mg/dL (ref 65–99)
Potassium: 4 mmol/L (ref 3.5–5.2)
Sodium: 141 mmol/L (ref 134–144)
TOTAL PROTEIN: 7.2 g/dL (ref 6.0–8.5)

## 2014-04-07 LAB — CBC WITH DIFFERENTIAL/PLATELET
BASOS ABS: 0.1 10*3/uL (ref 0.0–0.2)
Basos: 1 %
Eos: 3 %
Eosinophils Absolute: 0.2 10*3/uL (ref 0.0–0.4)
HEMATOCRIT: 42.6 % (ref 34.0–46.6)
HEMOGLOBIN: 14.8 g/dL (ref 11.1–15.9)
IMMATURE GRANULOCYTES: 0 %
Immature Grans (Abs): 0 10*3/uL (ref 0.0–0.1)
LYMPHS ABS: 2.7 10*3/uL (ref 0.7–3.1)
Lymphs: 36 %
MCH: 31.1 pg (ref 26.6–33.0)
MCHC: 34.7 g/dL (ref 31.5–35.7)
MCV: 90 fL (ref 79–97)
MONOS ABS: 0.7 10*3/uL (ref 0.1–0.9)
Monocytes: 10 %
NEUTROS ABS: 3.6 10*3/uL (ref 1.4–7.0)
Neutrophils Relative %: 50 %
RBC: 4.76 x10E6/uL (ref 3.77–5.28)
RDW: 11.7 % — AB (ref 12.3–15.4)
WBC: 7.3 10*3/uL (ref 3.4–10.8)

## 2014-04-07 LAB — LIPID PANEL
CHOL/HDL RATIO: 2.2 ratio (ref 0.0–4.4)
Cholesterol, Total: 121 mg/dL (ref 100–199)
HDL: 56 mg/dL (ref >39)
LDL Calculated: 56 mg/dL (ref 0–99)
Triglycerides: 47 mg/dL (ref 0–149)
VLDL Cholesterol Cal: 9 mg/dL (ref 5–40)

## 2014-04-07 LAB — VITAMIN D 1,25 DIHYDROXY: Vit D, 1,25-Dihydroxy: 66 pg/mL (ref 19.9–79.3)

## 2014-04-07 LAB — TSH: TSH: 1.08 u[IU]/mL (ref 0.450–4.500)

## 2014-04-08 ENCOUNTER — Telehealth: Payer: Self-pay

## 2014-04-08 ENCOUNTER — Telehealth: Payer: Self-pay | Admitting: *Deleted

## 2014-04-08 NOTE — Telephone Encounter (Signed)
Message copied by Lamont SnowballICE, Tennille Montelongo L on Fri Apr 08, 2014  9:47 AM ------      Message from: Oneal GroutPANDEY, MAHIMA      Created: Fri Apr 08, 2014  9:36 AM       Your blood test results are all normal. ------

## 2014-04-08 NOTE — Telephone Encounter (Signed)
Message copied by Lamont SnowballICE, SHARON L on Fri Apr 08, 2014  9:46 AM ------      Message from: Oneal GroutPANDEY, MAHIMA      Created: Fri Apr 08, 2014  9:37 AM       Your cholesterol level is normal. Decrease your simvastatin to 10 mg daily. Send new scripts. ------

## 2014-04-08 NOTE — Telephone Encounter (Signed)
Letter mailed, medication list updated to reflect change

## 2014-04-08 NOTE — Telephone Encounter (Signed)
Message copied by Maurice SmallBEATTY, Chanice Brenton C on Fri Apr 08, 2014  2:27 PM ------      Message from: Oneal GroutPANDEY, MAHIMA      Created: Fri Apr 08, 2014  9:36 AM       Your blood test results are all normal. ------

## 2014-04-13 ENCOUNTER — Ambulatory Visit (HOSPITAL_COMMUNITY): Payer: PRIVATE HEALTH INSURANCE

## 2014-04-20 ENCOUNTER — Ambulatory Visit (HOSPITAL_COMMUNITY): Payer: PRIVATE HEALTH INSURANCE

## 2014-04-22 ENCOUNTER — Ambulatory Visit (HOSPITAL_COMMUNITY)
Admission: RE | Admit: 2014-04-22 | Discharge: 2014-04-22 | Disposition: A | Discharge status: Home / Self Care | Payer: PRIVATE HEALTH INSURANCE | Source: Ambulatory Visit | Attending: Internal Medicine | Admitting: Internal Medicine

## 2014-04-22 DIAGNOSIS — I509 Heart failure, unspecified: Secondary | ICD-10-CM | POA: Insufficient documentation

## 2014-04-22 DIAGNOSIS — J449 Chronic obstructive pulmonary disease, unspecified: Secondary | ICD-10-CM

## 2014-04-22 DIAGNOSIS — R05 Cough: Secondary | ICD-10-CM | POA: Insufficient documentation

## 2014-04-22 DIAGNOSIS — Z87891 Personal history of nicotine dependence: Secondary | ICD-10-CM | POA: Insufficient documentation

## 2014-04-22 DIAGNOSIS — R059 Cough, unspecified: Secondary | ICD-10-CM | POA: Insufficient documentation

## 2014-04-22 NOTE — Progress Notes (Signed)
  Echocardiogram 2D Echocardiogram has been performed.  Arvil ChacoRachel Foster 04/22/2014, 12:46 PM

## 2014-04-27 ENCOUNTER — Encounter: Payer: Self-pay | Admitting: Internal Medicine

## 2014-04-27 ENCOUNTER — Ambulatory Visit (INDEPENDENT_AMBULATORY_CARE_PROVIDER_SITE_OTHER): Payer: PRIVATE HEALTH INSURANCE | Admitting: Internal Medicine

## 2014-04-27 VITALS — BP 140/86 | HR 60 | Temp 97.6°F | Wt 152.2 lb

## 2014-04-27 DIAGNOSIS — E785 Hyperlipidemia, unspecified: Secondary | ICD-10-CM

## 2014-04-27 DIAGNOSIS — M545 Low back pain, unspecified: Secondary | ICD-10-CM

## 2014-04-27 DIAGNOSIS — I509 Heart failure, unspecified: Secondary | ICD-10-CM

## 2014-04-27 DIAGNOSIS — G8929 Other chronic pain: Secondary | ICD-10-CM

## 2014-04-27 DIAGNOSIS — B360 Pityriasis versicolor: Secondary | ICD-10-CM

## 2014-04-27 DIAGNOSIS — D869 Sarcoidosis, unspecified: Secondary | ICD-10-CM

## 2014-04-27 DIAGNOSIS — J449 Chronic obstructive pulmonary disease, unspecified: Secondary | ICD-10-CM

## 2014-04-27 DIAGNOSIS — Z5181 Encounter for therapeutic drug level monitoring: Secondary | ICD-10-CM

## 2014-04-27 DIAGNOSIS — D860 Sarcoidosis of lung: Secondary | ICD-10-CM

## 2014-04-27 DIAGNOSIS — I5032 Chronic diastolic (congestive) heart failure: Secondary | ICD-10-CM

## 2014-04-27 DIAGNOSIS — J99 Respiratory disorders in diseases classified elsewhere: Secondary | ICD-10-CM

## 2014-04-27 MED ORDER — LUBIPROSTONE 8 MCG PO CAPS: ORAL_CAPSULE | ORAL | Status: DC

## 2014-04-27 MED ORDER — SIMVASTATIN 10 MG PO TABS: ORAL_TABLET | ORAL | Status: DC

## 2014-04-27 MED ORDER — FLUCONAZOLE 150 MG PO TABS: 300.0000 mg | ORAL_TABLET | ORAL | Status: DC

## 2014-04-27 MED ORDER — LISINOPRIL 20 MG PO TABS: ORAL_TABLET | ORAL | Status: DC

## 2014-04-27 NOTE — Progress Notes (Signed)
Patient ID: Alicia GipRegina A Estrada, female   DOB: [birth year], 43 y.o.   MRN: [id number]    Chief Complaint  Patient presents with  . Follow-up    rash, copd, chf, pain   Allergies  Allergen Reactions  . Naprosyn [Naproxen]   . Risperdal [Risperidone]   . Seroquel [Quetiapine Fumarate]    HPI 43 y/o female patient is here for follow up. Her rash has improved with fluconazole. Reviewed her cxr and echocardiogram result. Pain is under better control with increase in tramadol dosing. She continues to exercise.  She denies any problem with breathing this visit Has tetanus vaccine taken  Review of Systems  Constitutional: Negative for fever, chills, malaise/fatigue and diaphoresis.  HENT: Negative for congestion, hearing loss and sore throat.   Eyes: Negative for blurred vision, double vision and discharge.  Respiratory: Negative for wheezing.   Cardiovascular: Negative for chest pain, palpitations, orthopnea and leg swelling.  Gastrointestinal: Negative for heartburn, nausea, vomiting, abdominal pain, diarrhea.  Genitourinary: Negative for dysuria, urgency, frequency and flank pain.  Musculoskeletal: Negative for falls. Has weakness of her right arm limiting her activities of daily living now. Her friend brings her to office visit now Skin: has hypopigmented skin lesion with itching on her chest and back area Neurological: Positive for weakness of right arm. Negative for dizziness, tingling, focal weakness and headaches.  Psychiatric/Behavioral: Negative for depression and memory loss. The patient is not nervous/anxious.      [facility]  Past Medical History  Diagnosis Date  . Bipolar disorder   . CHF (congestive heart failure)   . Hypertension   . Seizure disorder   . Unspecified constipation   . Unspecified vitamin D deficiency   . Unspecified late effects of cerebrovascular disease   . Allergic rhinitis due to pollen   . Other malaise and fatigue   . Sarcoidosis    . Other and unspecified hyperlipidemia   . Osteoporosis, unspecified   . Cocaine dependence 2012  . Other and unspecified hyperlipidemia   . Other convulsions   . Other abnormal blood chemistry   . Unspecified sleep apnea   . Obesity, unspecified   . Tobacco use disorder     stopped smoking  . Depressive disorder, not elsewhere classified   . Unspecified hereditary and idiopathic peripheral neuropathy   . Reflux esophagitis   . Allergic rhinitis due to pollen    Past Surgical History  Procedure Laterality Date  . Cholecystectomy    . Lung biopsy    . Fracture surgery      right arm    Physical exam BP 140/86  Pulse 60  Temp(Src) 97.6 F (36.4 C) (Oral)  Wt 152 lb 3.2 oz (69.037 kg)  SpO2 92%  Constitutional: She is oriented to person, place, and time. She appears well-developed. No distress.  Neck: Normal range of motion. Neck supple. No JVD present.   Cardiovascular: Normal rate, regular rhythm and normal heart sounds.    Pulmonary/Chest: Effort normal and breath sounds normal. She has no wheezes. She has no rales. She exhibits no tenderness.   Abdominal: Soft. Bowel sounds are normal. She exhibits no distension and no mass. There is no guarding.  Musculoskeletal: strength 4/5 in right arm and hand. No tenderness. Normal ROM. Normal range of motion in other extremities. She exhibits no edema and no tenderness.  Lymphadenopathy: She has no cervical adenopathy.   Skin: Skin is warm and dry. She is not diaphoretic. Has hypopigmented areas followed by  hyperpigmented areas on her back and trunk and chest area.  improved from last visit Psychiatric: She has a normal mood and affect. Her behavior is normal.   Labs- CBC    Component Value Date/Time   WBC 7.3 04/05/2014 1032   WBC 9.7 09/02/2011 0605   RBC 4.76 04/05/2014 1032   RBC 4.66 09/02/2011 0605   HGB 14.8 04/05/2014 1032   HCT 42.6 04/05/2014 1032   PLT 324 09/02/2011 0605   MCV 90 04/05/2014 1032   MCH 31.1 04/05/2014 1032    MCH 31.1 09/02/2011 0605   MCHC 34.7 04/05/2014 1032   MCHC 35.1 09/02/2011 0605   RDW 11.7* 04/05/2014 1032   RDW 11.7 09/02/2011 0605   LYMPHSABS 2.7 04/05/2014 1032   LYMPHSABS 2.4 08/30/2011 1410   MONOABS 0.6 08/30/2011 1410   EOSABS 0.2 04/05/2014 1032   EOSABS 0.5 08/30/2011 1410   BASOSABS 0.1 04/05/2014 1032   BASOSABS 0.1 08/30/2011 1410    CMP     Component Value Date/Time   NA 141 04/05/2014 1032   NA 139 09/02/2011 0605   K 4.0 04/05/2014 1032   CL 101 04/05/2014 1032   CO2 23 04/05/2014 1032   GLUCOSE 76 04/05/2014 1032   GLUCOSE 93 09/02/2011 0605   BUN 11 04/05/2014 1032   BUN 10 09/02/2011 0605   CREATININE 0.80 04/05/2014 1032   CALCIUM 9.4 04/05/2014 1032   PROT 7.2 04/05/2014 1032   PROT 7.9 08/30/2011 1410   ALBUMIN 3.8 08/30/2011 1410   AST 11 04/05/2014 1032   ALT 6 04/05/2014 1032   ALKPHOS 59 04/05/2014 1032   BILITOT 0.8 04/05/2014 1032   GFRNONAA 90 04/05/2014 1032   GFRAA 104 04/05/2014 1032   04/22/14 echocardiogram Left ventricle: The cavity size was normal. Systolic function was normal. The estimated ejection fraction was in the range of 55% to 60%. Wall motion was normal; there were no regional wall motion abnormalities. There was a reduced contribution of atrial contraction to ventricular filling, due to increased ventricular diastolic pressure or atrial contractile dysfunction. Doppler parameters are consistent with a reversible restrictive pattern, indicative of decreased left ventricular diastolic compliance and/or increased left atrial pressure (grade 3 diastolic dysfunction).  04/25/14 cxr  IMPRESSION: Mild COPD   Interstitial changes are again appreciated though decreased in conspicuity likely reflecting the patient's history of sarcoidosis. No new focal regions of consolidation no new focal infiltrates  Assessment/plan  1. Chronic diastolic congestive heart failure Symptom stable. On ACEI. Will need to consider low dose b blocker next visit for mortality benefit  2.  Sarcoidosis of lung Stable. If symptom progresses, consider rx   3. Tinea versicolor Improving. Will continue her fluconazole 300 mg once a week for 8 weeks and reassess.   4. Chronic low back pain Better controlled with tramadol and gabapentin. Will get genetic testing to assess med efficacy  5. Hyperlipidemia LDL goal < 130 imporved lipid panel, decrease zocor to 10 mg daily, new script provided  6. COPD (chronic obstructive pulmonary disease) Pt would like to defer bronchodilator treatment for now. Monitor for symptoms and revisit this next visit

## 2014-06-18 ENCOUNTER — Other Ambulatory Visit: Payer: Self-pay | Admitting: Internal Medicine

## 2014-06-20 NOTE — Telephone Encounter (Signed)
Patient requesting refill for Diflucan, it was only suppose to be taken for 8 weeks. Please advise.

## 2014-06-20 NOTE — Telephone Encounter (Signed)
[name], has she completed 8 week treatment? If yes, i need to reassess her skin and if no improvemnt noted, will provide dermatology referral

## 2014-08-02 ENCOUNTER — Ambulatory Visit: Payer: PRIVATE HEALTH INSURANCE | Admitting: Internal Medicine

## 2014-08-02 DIAGNOSIS — Z0289 Encounter for other administrative examinations: Secondary | ICD-10-CM

## 2014-08-09 ENCOUNTER — Encounter: Payer: Self-pay | Admitting: Internal Medicine

## 2014-08-09 ENCOUNTER — Ambulatory Visit (INDEPENDENT_AMBULATORY_CARE_PROVIDER_SITE_OTHER): Payer: PRIVATE HEALTH INSURANCE | Admitting: Internal Medicine

## 2014-08-09 ENCOUNTER — Telehealth: Payer: Self-pay | Admitting: Internal Medicine

## 2014-08-09 VITALS — BP 138/84 | HR 51 | Temp 98.0°F | Ht 64.0 in | Wt 148.2 lb

## 2014-08-09 DIAGNOSIS — M545 Low back pain, unspecified: Secondary | ICD-10-CM

## 2014-08-09 DIAGNOSIS — B360 Pityriasis versicolor: Secondary | ICD-10-CM

## 2014-08-09 DIAGNOSIS — J438 Other emphysema: Secondary | ICD-10-CM

## 2014-08-09 DIAGNOSIS — I6930 Unspecified sequelae of cerebral infarction: Secondary | ICD-10-CM

## 2014-08-09 DIAGNOSIS — I10 Essential (primary) hypertension: Secondary | ICD-10-CM

## 2014-08-09 DIAGNOSIS — K219 Gastro-esophageal reflux disease without esophagitis: Secondary | ICD-10-CM

## 2014-08-09 DIAGNOSIS — K5900 Constipation, unspecified: Secondary | ICD-10-CM

## 2014-08-09 DIAGNOSIS — G8929 Other chronic pain: Secondary | ICD-10-CM

## 2014-08-09 DIAGNOSIS — I6990 Unspecified sequelae of unspecified cerebrovascular disease: Secondary | ICD-10-CM

## 2014-08-09 DIAGNOSIS — G4700 Insomnia, unspecified: Secondary | ICD-10-CM

## 2014-08-09 DIAGNOSIS — E785 Hyperlipidemia, unspecified: Secondary | ICD-10-CM

## 2014-08-09 MED ORDER — LISINOPRIL 20 MG PO TABS: ORAL_TABLET | ORAL | Status: AC

## 2014-08-09 MED ORDER — METOPROLOL SUCCINATE ER 25 MG PO TB24: 12.5000 mg | ORAL_TABLET | Freq: Every day | ORAL | Status: DC

## 2014-08-09 MED ORDER — PRAVASTATIN SODIUM 10 MG PO TABS: 10.0000 mg | ORAL_TABLET | Freq: Every day | ORAL | Status: DC

## 2014-08-09 MED ORDER — ALBUTEROL SULFATE HFA 108 (90 BASE) MCG/ACT IN AERS: 2.0000 | INHALATION_SPRAY | Freq: Four times a day (QID) | RESPIRATORY_TRACT | Status: AC | PRN

## 2014-08-09 NOTE — Progress Notes (Signed)
Patient ID: [name], female   DOB: 05/01/1971, 43 y.o.   MRN: [id number]    Chief Complaint  Patient presents with  . Follow-up    3 month f/u & discuss Genetic Test Results  . Rash    rash on back still itching   HPI 43 y/o female patient is here for follow up. Her rash had improved with fluconazole but she is out of it now and this has recurred and it itches mainly when she is sweating. She continues to exercise. Has lost 4 lbs. Breathing is better now. Continues to stop smoking- been a year in June 2015 Wt Readings from Last 3 Encounters:  08/09/14 148 lb 3.2 oz (67.223 kg)  04/27/14 152 lb 3.2 oz (69.037 kg)  04/05/14 152 lb 12.8 oz (69.31 kg)   Pain is under better control with increase in tramadol dosing. She continues to exercise.    Review of Systems   Constitutional: Negative for fever, chills, malaise/fatigue   HENT: Negative for congestion, hearing loss and sore throat.    Eyes: Negative for blurred vision, double vision and discharge. Has prescription eye glasses  Respiratory: Negative for wheezing.    Cardiovascular: Negative for chest pain, palpitations, orthopnea and leg swelling.   Gastrointestinal: Negative for heartburn, nausea, vomiting, abdominal pain, diarrhea, constipation. amitiza has been helpful  Genitourinary: Negative for dysuria, urgency, frequency and flank pain.   Musculoskeletal: Negative for falls. Has weakness of her right arm limiting her activities of daily living now. Transportation brings her to her appointment Skin: has hypopigmented skin lesion with itching on her chest and back area Neurological: Positive for weakness of right arm. Negative for dizziness, tingling, focal weakness and headaches.   Psychiatric/Behavioral: Negative for depression and memory loss. The patient is not nervous/anxious.    Past Medical History  Diagnosis Date  . Bipolar disorder   . CHF (congestive heart failure)   . Hypertension   . Seizure disorder   .  Unspecified constipation   . Unspecified vitamin D deficiency   . Unspecified late effects of cerebrovascular disease   . Allergic rhinitis due to pollen   . Other malaise and fatigue   . Sarcoidosis   . Other and unspecified hyperlipidemia   . Osteoporosis, unspecified   . Cocaine dependence 2012  . Other and unspecified hyperlipidemia   . Other convulsions   . Other abnormal blood chemistry   . Unspecified sleep apnea   . Obesity, unspecified   . Tobacco use disorder     stopped smoking  . Depressive disorder, not elsewhere classified   . Unspecified hereditary and idiopathic peripheral neuropathy   . Reflux esophagitis   . Allergic rhinitis due to pollen    Current Outpatient Prescriptions on File Prior to Visit  Medication Sig Dispense Refill  . amLODipine (NORVASC) 5 MG tablet Take one tablet once daily for blood pressure and heart  90 tablet  1  . aspirin 81 MG tablet Take 1 tablet (81 mg total) by mouth daily.  30 tablet  5  . clopidogrel (PLAVIX) 75 MG tablet Take one tablet once daily  90 tablet  1  . dexlansoprazole (DEXILANT) 60 MG capsule TAKE ONE CAPSULE BY MOUTH EVERY DAY  90 capsule  1  . fluconazole (DIFLUCAN) 150 MG tablet Take 2 tablets (300 mg total) by mouth once a week.  8 tablet  0  . gabapentin (NEURONTIN) 100 MG capsule Take one capsule three times daily for pains  90 capsule  1  . lisinopril (PRINIVIL,ZESTRIL) 20 MG tablet Take one tablet by mouth once daily for blood pressure  90 tablet  1  . lubiprostone (AMITIZA) 8 MCG capsule TAKE ONE CAPSULE BY MOUTH TWICE DAILY FOR  CONSTIPATION  180 capsule  1  . simvastatin (ZOCOR) 10 MG tablet ONE TABLET BY MOUTH EVERY DAY TO  LOWER  CHOLESTEROL  30 tablet  3  . traMADol (ULTRAM) 50 MG tablet Take one tablet every 6 hours as needed for pain 1-5/10 and 2 tablets every 6 hour as needed for pain 6-10/10  240 tablet  0  . zolpidem (AMBIEN) 5 MG tablet Take 1 tablet (5 mg total) by mouth at bedtime as needed for sleep.   30 tablet  3   No current facility-administered medications on file prior to visit.   Physical exam BP 138/84  Pulse 51  Temp(Src) 98 F (36.7 C) (Oral)  Ht 5\' 4"  (1.626 m)  Wt 148 lb 3.2 oz (67.223 kg)  BMI 25.43 kg/m2  SpO2 97%  Constitutional: She is oriented to person, place, and time. She appears well-developed. No distress.   Neck: Normal range of motion. Neck supple. No JVD present.   Cardiovascular: Normal rate, regular rhythm and normal heart sounds.    Pulmonary/Chest: Effort normal and breath sounds normal. She has no wheezes. She has no rales. She exhibits no tenderness.   Abdominal: Soft. Bowel sounds are normal. She exhibits no distension and no mass. There is no guarding.  Musculoskeletal: strength 4/5 in right arm and hand. No tenderness. Normal ROM. Normal range of motion in other extremities. She exhibits no edema and no tenderness.  Lymphadenopathy: She has no cervical adenopathy.   Skin: Skin is warm and dry. She is not diaphoretic. Has hypopigmented areas followed by hyperpigmented areas on her back and trunk and chest area.  improved from last visit Psychiatric: She has a normal mood and affect. Her behavior is normal.   Labs-  Lab Results  Component Value Date   WBC 7.3 04/05/2014   HGB 14.8 04/05/2014   HCT 42.6 04/05/2014   MCV 90 04/05/2014   PLT 324 09/02/2011   CMP     Component Value Date/Time   NA 141 04/05/2014 1032   NA 139 09/02/2011 0605   K 4.0 04/05/2014 1032   CL 101 04/05/2014 1032   CO2 23 04/05/2014 1032   GLUCOSE 76 04/05/2014 1032   GLUCOSE 93 09/02/2011 0605   BUN 11 04/05/2014 1032   BUN 10 09/02/2011 0605   CREATININE 0.80 04/05/2014 1032   CALCIUM 9.4 04/05/2014 1032   PROT 7.2 04/05/2014 1032   PROT 7.9 08/30/2011 1410   ALBUMIN 3.8 08/30/2011 1410   AST 11 04/05/2014 1032   ALT 6 04/05/2014 1032   ALKPHOS 59 04/05/2014 1032   BILITOT 0.8 04/05/2014 1032   GFRNONAA 90 04/05/2014 1032   GFRAA 104 04/05/2014 1032   Lipid Panel     Component Value Date/Time    CHOL 152 08/31/2011 0600   TRIG 47 04/05/2014 1032   HDL 56 04/05/2014 1032   HDL 46 08/31/2011 0600   CHOLHDL 2.2 04/05/2014 1032   CHOLHDL 3.3 08/31/2011 0600   VLDL 23 08/31/2011 0600   LDLCALC 56 04/05/2014 1032   LDLCALC 83 08/31/2011 0600    Assessment/plan  Hypertension Stable. Continue amlodipine 5 mg daily and lisinopril 20 mg daily  Late effect of cva Stable. Continue asa and plavix for now with her bp meds and zocor  GERD Continue dexilant 60 mg daily  Chronic diastolic congestive heart failure Symptom stable. On ACEI. Will introduce low dose toprol 12.5 mg daily for mortality benefit with her chf (diastolic grade 3)  Tinea versicolor Improved some but persists. Refer to dermatology   Insomnia ambien helpful, continue this  Constipation Continue amitiza  Chronic low back pain Better controlled with tramadol and gabapentin.   Hyperlipidemia LDL goal < 130 Reviewed genetic testing, with simvastatin increasing risk for myopathy, d/c and start pravastatin 10 mg daily, new script provided  COPD  Will have her on proventil inhaler prn for now and monitor clinically

## 2014-08-09 NOTE — Patient Instructions (Signed)
Stop taking simvastatin  Start taking new cholesterol lowering pill pravastatin once a day. Also to take metoprolol half a tablet daily for your heart and albuterol inhaler for your lungs from today

## 2014-08-09 NOTE — Telephone Encounter (Signed)
Patient has [employer].. We can not schedule appointment For Dermatology at this time.. Patient did not inform front end staff that she had [employer]. Gave patient information to call [employer] to have her plan changed from [place] [employer] to regular [employer]. Told her to give me a call once that is done so that we can do her referral. Will have to cancel referral for now until patients [employer] has been Changed

## 2014-08-15 DIAGNOSIS — Z029 Encounter for administrative examinations, unspecified: Secondary | ICD-10-CM

## 2014-08-16 ENCOUNTER — Encounter: Payer: Self-pay | Admitting: Internal Medicine

## 2014-09-15 ENCOUNTER — Other Ambulatory Visit: Payer: Self-pay | Admitting: *Deleted

## 2014-09-15 MED ORDER — TRAMADOL HCL 50 MG PO TABS: ORAL_TABLET | ORAL | Status: DC

## 2014-09-15 NOTE — Telephone Encounter (Signed)
[facility] [place] 

## 2014-09-21 ENCOUNTER — Other Ambulatory Visit: Payer: Self-pay | Admitting: *Deleted

## 2014-09-21 MED ORDER — TRAMADOL HCL 50 MG PO TABS: ORAL_TABLET | ORAL | Status: DC

## 2014-09-21 NOTE — Telephone Encounter (Signed)
[facility] [place] 

## 2014-09-28 DIAGNOSIS — Z029 Encounter for administrative examinations, unspecified: Secondary | ICD-10-CM

## 2014-12-02 ENCOUNTER — Other Ambulatory Visit: Payer: Self-pay | Admitting: *Deleted

## 2014-12-02 MED ORDER — ZOLPIDEM TARTRATE 5 MG PO TABS: 5.0000 mg | ORAL_TABLET | Freq: Every evening | ORAL | Status: DC | PRN

## 2014-12-02 NOTE — Telephone Encounter (Signed)
[facility] [place] 

## 2014-12-15 ENCOUNTER — Other Ambulatory Visit: Payer: PRIVATE HEALTH INSURANCE

## 2014-12-15 DIAGNOSIS — M545 Low back pain, unspecified: Secondary | ICD-10-CM

## 2014-12-15 DIAGNOSIS — E785 Hyperlipidemia, unspecified: Secondary | ICD-10-CM

## 2014-12-15 DIAGNOSIS — K219 Gastro-esophageal reflux disease without esophagitis: Secondary | ICD-10-CM

## 2014-12-15 DIAGNOSIS — I10 Essential (primary) hypertension: Secondary | ICD-10-CM

## 2014-12-15 DIAGNOSIS — I6990 Unspecified sequelae of unspecified cerebrovascular disease: Secondary | ICD-10-CM

## 2014-12-15 DIAGNOSIS — K5900 Constipation, unspecified: Secondary | ICD-10-CM

## 2014-12-15 DIAGNOSIS — G8929 Other chronic pain: Secondary | ICD-10-CM

## 2014-12-16 LAB — COMPREHENSIVE METABOLIC PANEL
A/G RATIO: 1.5 (ref 1.1–2.5)
ALBUMIN: 4.1 g/dL (ref 3.5–5.5)
ALT: 8 IU/L (ref 0–32)
AST: 11 IU/L (ref 0–40)
Alkaline Phosphatase: 66 IU/L (ref 39–117)
BUN / CREAT RATIO: 10 (ref 9–23)
BUN: 7 mg/dL (ref 6–24)
CALCIUM: 9.2 mg/dL (ref 8.7–10.2)
CO2: 23 mmol/L (ref 18–29)
CREATININE: 0.72 mg/dL (ref 0.57–1.00)
Chloride: 105 mmol/L (ref 97–108)
GFR calc Af Amer: 118 mL/min/{1.73_m2} (ref >59)
GFR calc non Af Amer: 102 mL/min/{1.73_m2} (ref >59)
Globulin, Total: 2.8 g/dL (ref 1.5–4.5)
Glucose: 92 mg/dL (ref 65–99)
Potassium: 4 mmol/L (ref 3.5–5.2)
Sodium: 139 mmol/L (ref 134–144)
Total Bilirubin: 0.9 mg/dL (ref 0.0–1.2)
Total Protein: 6.9 g/dL (ref 6.0–8.5)

## 2014-12-16 LAB — LIPID PANEL
Chol/HDL Ratio: 1.9 ratio units (ref 0.0–4.4)
Cholesterol, Total: 114 mg/dL (ref 100–199)
HDL: 59 mg/dL (ref >39)
LDL CALC: 41 mg/dL (ref 0–99)
Triglycerides: 68 mg/dL (ref 0–149)
VLDL Cholesterol Cal: 14 mg/dL (ref 5–40)

## 2014-12-16 LAB — CBC WITH DIFFERENTIAL/PLATELET
BASOS ABS: 0.1 10*3/uL (ref 0.0–0.2)
Basos: 1 %
EOS ABS: 0.3 10*3/uL (ref 0.0–0.4)
Eos: 4 %
HCT: 41.4 % (ref 34.0–46.6)
HEMOGLOBIN: 14.1 g/dL (ref 11.1–15.9)
IMMATURE GRANS (ABS): 0 10*3/uL (ref 0.0–0.1)
Immature Granulocytes: 0 %
LYMPHS: 41 %
Lymphocytes Absolute: 3.6 10*3/uL — ABNORMAL HIGH (ref 0.7–3.1)
MCH: 31.5 pg (ref 26.6–33.0)
MCHC: 34.1 g/dL (ref 31.5–35.7)
MCV: 93 fL (ref 79–97)
MONOCYTES: 9 %
Monocytes Absolute: 0.8 10*3/uL (ref 0.1–0.9)
NEUTROS ABS: 3.9 10*3/uL (ref 1.4–7.0)
NEUTROS PCT: 45 %
RBC: 4.47 x10E6/uL (ref 3.77–5.28)
RDW: 12.3 % (ref 12.3–15.4)
WBC: 8.7 10*3/uL (ref 3.4–10.8)

## 2014-12-16 LAB — TSH: TSH: 1.69 u[IU]/mL (ref 0.450–4.500)

## 2014-12-16 LAB — VITAMIN D 1,25 DIHYDROXY: VIT D 1 25 DIHYDROXY: 61.9 pg/mL (ref 19.9–79.3)

## 2014-12-20 ENCOUNTER — Encounter: Payer: Self-pay | Admitting: Internal Medicine

## 2014-12-20 ENCOUNTER — Ambulatory Visit (INDEPENDENT_AMBULATORY_CARE_PROVIDER_SITE_OTHER): Payer: PRIVATE HEALTH INSURANCE

## 2014-12-20 ENCOUNTER — Ambulatory Visit (INDEPENDENT_AMBULATORY_CARE_PROVIDER_SITE_OTHER): Payer: PRIVATE HEALTH INSURANCE | Admitting: Internal Medicine

## 2014-12-20 VITALS — BP 128/72 | HR 54 | Temp 97.7°F | Resp 10 | Ht 64.0 in | Wt 147.4 lb

## 2014-12-20 DIAGNOSIS — K219 Gastro-esophageal reflux disease without esophagitis: Secondary | ICD-10-CM

## 2014-12-20 DIAGNOSIS — R413 Other amnesia: Secondary | ICD-10-CM

## 2014-12-20 DIAGNOSIS — Z1239 Encounter for other screening for malignant neoplasm of breast: Secondary | ICD-10-CM

## 2014-12-20 DIAGNOSIS — B360 Pityriasis versicolor: Secondary | ICD-10-CM

## 2014-12-20 DIAGNOSIS — E785 Hyperlipidemia, unspecified: Secondary | ICD-10-CM | POA: Insufficient documentation

## 2014-12-20 DIAGNOSIS — M545 Low back pain: Secondary | ICD-10-CM

## 2014-12-20 DIAGNOSIS — I10 Essential (primary) hypertension: Secondary | ICD-10-CM

## 2014-12-20 DIAGNOSIS — J438 Other emphysema: Secondary | ICD-10-CM

## 2014-12-20 DIAGNOSIS — K5900 Constipation, unspecified: Secondary | ICD-10-CM

## 2014-12-20 DIAGNOSIS — Z124 Encounter for screening for malignant neoplasm of cervix: Secondary | ICD-10-CM

## 2014-12-20 DIAGNOSIS — I5032 Chronic diastolic (congestive) heart failure: Secondary | ICD-10-CM

## 2014-12-20 DIAGNOSIS — Z780 Asymptomatic menopausal state: Secondary | ICD-10-CM

## 2014-12-20 DIAGNOSIS — G8929 Other chronic pain: Secondary | ICD-10-CM

## 2014-12-20 DIAGNOSIS — I6990 Unspecified sequelae of unspecified cerebrovascular disease: Secondary | ICD-10-CM

## 2014-12-20 DIAGNOSIS — R29898 Other symptoms and signs involving the musculoskeletal system: Secondary | ICD-10-CM

## 2014-12-20 DIAGNOSIS — Z23 Encounter for immunization: Secondary | ICD-10-CM

## 2014-12-20 MED ORDER — CITALOPRAM HYDROBROMIDE 20 MG PO TABS: 20.0000 mg | ORAL_TABLET | Freq: Every day | ORAL | Status: AC

## 2014-12-20 MED ORDER — RANITIDINE HCL 150 MG PO TABS: 150.0000 mg | ORAL_TABLET | Freq: Two times a day (BID) | ORAL | Status: DC

## 2014-12-20 NOTE — Progress Notes (Signed)
Passed clock drawing 

## 2014-12-20 NOTE — Patient Instructions (Signed)
Please make appointment with psychiatry, GI services and your skin specialist as advised

## 2014-12-20 NOTE — Progress Notes (Signed)
Patient ID: [name], female   DOB: 10-11-1971, 43 y.o.   MRN: [id number]    Chief Complaint  Patient presents with  . Annual Exam    Yearly check-up, discuss labs (copy printed). Pap  . Leg Problem    Right leg is very heavy, patient with 2 fall within the last week due to leg heaviness    Allergies  Allergen Reactions  . Naprosyn [Naproxen]   . Risperdal [Risperidone]   . Seroquel [Quetiapine Fumarate]    HPI 43 y/o female patient is here for her physical. She has been complaint with her medications.  She had a fall 4 days back when she was stepping out of the kitchen and felt her right leg giving away. She has pain in her right leg and hip since then. Denies radiation of pain. Denies numbness or tingling. She feels heaviness in her right leg with weakness. No fall after this. Not using any assistive device at present She has been hearing voices in her head on and off for 2 weeks- they ask her to kill herself at times. They tell her mean things. She then realizes that these are voices and does not act upon them. No new medications. She has been under a lot of stress and tearful and feels low. She has history of bipolar disorder in past but is currently off all medications. Denies visual hallucinations. No hallucination today. She has history of stroke in the past, HTN, constipation, HLD, insomnia, GERD, peripheral neuropathy and chronic low back pain She is due for pap smear and influenza vaccine Her disability benefits will end in feb 2016 and this is stressing her out as she feels she will not be able to work. She also mentions having difficulty finding jos at present.  Review of Systems  Constitutional: Negative for fever, chills, diaphoresis. feels low in terms of energy and has hot flashes HENT: Negative for congestion, hearing loss and sore throat.   Eyes: Negative for blurred vision, double vision and discharge. last saw her eye doctor 1 year back Respiratory: Negative for  cough, shortness of breath and wheezing.  using an inhaler on as needed basis Cardiovascular: Negative for chest pain, palpitations, leg swelling. occassionally feels chest tightness when she gets anxious, deep breathing helps it resolve. Gastrointestinal: Negative for nausea, abdominal pain. amitiza helps with constipation. has been having intermittent vomiting with streaks of blood and heartburn for past 2 weeks.  Genitourinary: Negative for dysuria, urgency, frequency and flank pain. negative for vaginal discharge. Last pap smear 3 years back was normal per patient Musculoskeletal: Negative for myalgias.  Skin: pending appointment with dermatology  Neurological: Negative for dizziness, tingling, focal weakness.   Past Medical History  Diagnosis Date  . Bipolar disorder   . CHF (congestive heart failure)   . Hypertension   . Seizure disorder   . Unspecified constipation   . Unspecified vitamin D deficiency   . Unspecified late effects of cerebrovascular disease   . Allergic rhinitis due to pollen   . Other malaise and fatigue   . Sarcoidosis   . Other and unspecified hyperlipidemia   . Osteoporosis, unspecified   . Cocaine dependence 2012  . Other and unspecified hyperlipidemia   . Other convulsions   . Other abnormal blood chemistry   . Unspecified sleep apnea   . Obesity, unspecified   . Tobacco use disorder     stopped smoking  . Depressive disorder, not elsewhere classified   . Unspecified hereditary and  idiopathic peripheral neuropathy   . Reflux esophagitis   . Allergic rhinitis due to pollen    Past Surgical History  Procedure Laterality Date  . Cholecystectomy    . Lung biopsy    . Fracture surgery      right arm   Current Outpatient Prescriptions on File Prior to Visit  Medication Sig Dispense Refill  . albuterol (PROVENTIL HFA;VENTOLIN HFA) 108 (90 BASE) MCG/ACT inhaler Inhale 2 puffs into the lungs every 6 (six) hours as needed for wheezing or shortness of  breath. 1 Inhaler 2  . amLODipine (NORVASC) 5 MG tablet Take one tablet once daily for blood pressure and heart 90 tablet 1  . aspirin 81 MG tablet Take 1 tablet (81 mg total) by mouth daily. 30 tablet 5  . clopidogrel (PLAVIX) 75 MG tablet Take one tablet once daily 90 tablet 1  . dexlansoprazole (DEXILANT) 60 MG capsule TAKE ONE CAPSULE BY MOUTH EVERY DAY 90 capsule 1  . gabapentin (NEURONTIN) 100 MG capsule Take one capsule three times daily for pains 90 capsule 1  . lisinopril (PRINIVIL,ZESTRIL) 20 MG tablet Take one tablet by mouth once daily for blood pressure 90 tablet 1  . lubiprostone (AMITIZA) 8 MCG capsule TAKE ONE CAPSULE BY MOUTH TWICE DAILY FOR  CONSTIPATION 180 capsule 1  . pravastatin (PRAVACHOL) 10 MG tablet Take 1 tablet (10 mg total) by mouth daily. 90 tablet 3  . traMADol (ULTRAM) 50 MG tablet Take one to two tablets by mouth every 6 hours as needed for pain as directed (one tablet for pain 1-5/10 and two tablets for pain 6-10/10) 240 tablet 0  . zolpidem (AMBIEN) 5 MG tablet Take 1 tablet (5 mg total) by mouth at bedtime as needed for sleep. 30 tablet 3  . metoprolol succinate (TOPROL-XL) 25 MG 24 hr tablet Take 0.5 tablets (12.5 mg total) by mouth daily. (Patient not taking: Reported on 12/20/2014) 45 tablet 3   No current facility-administered medications on file prior to visit.    Family History  Problem Relation Age of Onset  . Diabetes Mother   . Hypertension Mother   . Hypertension Father   . Stroke Father   . Cancer Paternal Uncle     breast   History   Social History  . Marital Status: Single    Spouse Name: N/A    Number of Children: N/A  . Years of Education: N/A   Occupational History  . Not on file.   Social History Main Topics  . Smoking status: Former Smoker    Quit date: 05/30/2013  . Smokeless tobacco: Not on file  . Alcohol Use: No  . Drug Use: No  . Sexual Activity: Not on file   Other Topics Concern  . Not on file   Social History  Narrative   Physical exam BP 128/72 mmHg  Pulse 54  Temp(Src) 97.7 F (36.5 C) (Oral)  Resp 10  Ht 5\' 4"  (1.626 m)  Wt 147 lb 6.4 oz (66.86 kg)  BMI 25.29 kg/m2  SpO2 99%    Wt Readings from Last 3 Encounters:  12/20/14 147 lb 6.4 oz (66.86 kg)  08/09/14 148 lb 3.2 oz (67.223 kg)  04/27/14 152 lb 3.2 oz (69.037 kg)   General- adult female in no acute distress Head- atraumatic, normocephalic Eyes- PERRLA, EOMI, no pallor, no icterus, no discharge Ears- left ear normal tympanic membrane and normal external ear canal , right ear normal tympanic membrane and normal external ear canal Neck-  no lymphadenopathy, no thyromegaly, no jugular vein distension, no carotid bruit Nose- normal nasal mucosa, no maxillary sinus tenderness, no frontal sinus tenderness Mouth- normal mucus membrane, no oral thrush, normal oropharynx Chest- no chest wall deformities, no chest wall tenderness Cardiovascular- normal s1,s2, no murmurs, normal distal pulses Respiratory- bilateral clear to auscultation, no wheeze, no rhonchi, no crackles Abdomen- bowel sounds present, soft, non tender, no guarding or rigidity, no CVA tenderness, no epigastric pain Pelvic exam- normal pelvic exam, pap smear sent, no adnexal tenderness, no vaginal discharge Musculoskeletal- able to move all 4 extremities, SLR illicits pain, needs help in lifting her right leg up and unable to hold it when support removed, no pain on palpation at hip joint, no spinal and paraspinal tenderness, no use of assistive device, no leg edema, good femoral pulse and dorsalis pedis, lifts her right leg up while walking in stepping gait fashion, no foot drop noted, good inversion and eversion of foot. strength 3/5 in RLE (new) and 4/5 in RUE (old), normal muscle tone Neurological- no focal deficit, normal reflexes, patient unable to elevate her right leg against gravity, able to self flex and extend at the hip joint during pap smear without any difficulty ,  she was able to hold herself in this position during pelvic exam and then able to push herself up on the exam table, extend her legs and get off the exam table without any assistance, normal sensation to fine touch and vibration except for on right great toe where she was not able to sense the microfilament Skin- warm and dry, improving tinea versicolor rash Psychiatry- alert and oriented to person, place and time but confused at times during conversation, normal mood and affect during visit  Labs-  CBC Latest Ref Rng 12/15/2014 04/05/2014 09/02/2011  WBC 3.4 - 10.8 x10E3/uL 8.7 7.3 9.7  Hemoglobin 11.1 - 15.9 g/dL 16.114.1 09.614.8 04.514.5  Hematocrit 34.0 - 46.6 % 41.4 42.6 41.3  Platelets 150 - 400 K/uL - - 324   CMP Latest Ref Rng 12/15/2014 04/05/2014 06/04/2013  Glucose 65 - 99 mg/dL 92 76 84  BUN 6 - 24 mg/dL 7 11 8   Creatinine 0.57 - 1.00 mg/dL 4.090.72 8.110.80 9.140.73  Sodium 134 - 144 mmol/L 139 141 141  Potassium 3.5 - 5.2 mmol/L 4.0 4.0 4.2  Chloride 97 - 108 mmol/L 105 101 104  CO2 18 - 29 mmol/L 23 23 24   Calcium 8.7 - 10.2 mg/dL 9.2 9.4 9.7  Total Protein 6.0 - 8.5 g/dL 6.9 7.2 7.1  Albumin 3.5 - 5.5 g/dL 4.1 4.3 4.3  Total Bilirubin 0.0 - 1.2 mg/dL 0.9 0.8 7.8(G1.6(H)  Alkaline Phos 39 - 117 IU/L 66 59 79  AST 0 - 40 IU/L 11 11 18   ALT 0 - 32 IU/L 8 6 21    Lipid Panel     Component Value Date/Time   CHOL 152 08/31/2011 0600   TRIG 68 12/15/2014 0830   HDL 59 12/15/2014 0830   HDL 46 08/31/2011 0600   CHOLHDL 1.9 12/15/2014 0830   CHOLHDL 3.3 08/31/2011 0600   VLDL 23 08/31/2011 0600   LDLCALC 41 12/15/2014 0830   LDLCALC 83 08/31/2011 0600   Lab Results  Component Value Date   TSH 1.690 12/15/2014   Normal vitamin d level  12/20/14 MMSE- 24/30, passed clock draw  Imaging:  02/06/12 dexa scan- T score of -0.8, normal  02/10/12 mammogram- normal  03/03/13 lumbar spine xray- normal alignment, normal intervertebral disc spaces  08/30/11 mri  brain IMPRESSION:   1.  Acute / subacute 7  mm non hemorrhagic infarct involving the left thalamus and extending superiorly. 2.  Mild periventricular white matter changes are advanced for age. The finding is nonspecific but can be seen in the setting of chronic microvascular ischemia, a demyelinating process such as multiple sclerosis, vasculitis, complicated migraine headaches, or as the sequelae of a prior infectious or inflammatory process. 3.  Circumferential mucosal thickening of the left maxillary sinus.   Assessment/plan  Right leg weakness Pt unable to raise leg against gravity and has stepping gait on right leg. Her fall and trauma to right hip could be causing the pain limiting her mobility some but unable to obtain clear picture on exam. Will get pelvis and Right hip xray to rule out any dislocation/ fracture. Given white matter changes on mri brain in 2012 with concern of MS changes, will get repeat mri brain to assess for the same. Continue prn tramadol for pain. After reviewing xray and mri, consider physical therapy  Mild cognitive impairment Normal thyroid panel, normal MCV. Has hx of stroke in past, bp controlled at present but has hx of htn and hyperlipidemia and could be having some vascular component leading to this new cognitive impairment. On review of mri brain from 2012 mild periventricular white matter changes noted. Will get a repeat MRI of brain to assess for demyelination and ventricular changes  Annual exam the patient was counseled regarding the appropriate use of alcohol, regular self-examination of the breasts on a monthly basis, prevention of dental and periodontal disease, diet, regular sustained exercise for at least 30 minutes 5 times per week, routine screening interval for mammogram as recommended by the American Cancer Society and ACOG, the proper use of sunscreen and protective clothing, tobacco use, and recommended schedule for GI hemoccult testing, colonoscopy, cholesterol, thyroid and diabetes  screening. Mammogram and dexa scan scheduled. Pap smear sent. Influenza vaccine provided  Hypertension Stable. Continue amlodipine 5 mg daily and lisinopril 20 mg daily  Late effect of cva Continue asa and plavix for now with her bp meds and zocor. Normal lipid panel. Mri brain to assess further with new right leg weakness  GERD Continue dexilant 60 mg daily. Add zantac 150 mg daily and recommended to f/u with her GI  Chronic diastolic congestive heart failure Symptom stable. On lisinopril and toprol.  Depression New onset of symptom, likely stress being a big contributor. Start celexa 20 mg daily and reassess in 4 weeks. Encouraged pt to see a psychiatrist ASAP, list of providers provided  Tinea versicolor Improved some but persists. Pending dermatology appointment  Insomnia ambien helpful, continue this  Constipation Continue amitiza  Chronic low back pain Better controlled with tramadol and gabapentin.   Hyperlipidemia LDL goal < 130 Continue pravastatin 10 mg daily  COPD   Stable on proventil inhaler prn for now and monitor clinically

## 2014-12-23 LAB — PAP IG (IMAGE GUIDED): PAP Smear Comment: 0

## 2014-12-27 ENCOUNTER — Other Ambulatory Visit: Payer: PRIVATE HEALTH INSURANCE

## 2014-12-27 ENCOUNTER — Telehealth: Payer: Self-pay

## 2014-12-27 MED ORDER — METRONIDAZOLE ER 750 MG PO TB24: 750.0000 mg | ORAL_TABLET | Freq: Every day | ORAL | Status: DC

## 2014-12-27 NOTE — Telephone Encounter (Signed)
-----   Message from Oneal GroutMahima Pandey, MD sent at 12/27/2014  9:39 AM EST ----- You have vaginal infection with a protozoa called trichomonas. You will need a course of antibiotic for this- metronidazole ER 750 mg daily for 1 week.  This can be sexually transmitted. If you are sexually active, i would recommend your partner to wear condoms until completion of treatment. He will also need treatment and will need to see his doctor.  Your pap smear is otherwise normal, no signs of malignancy

## 2014-12-27 NOTE — Telephone Encounter (Signed)
Discussed results with patient, patient verbalized understanding of results. RX sent to pharmacy [facility] on [address]).

## 2015-01-03 ENCOUNTER — Encounter: Payer: Self-pay | Admitting: Internal Medicine

## 2015-01-09 ENCOUNTER — Ambulatory Visit: Payer: PRIVATE HEALTH INSURANCE

## 2015-01-09 ENCOUNTER — Ambulatory Visit
Admission: RE | Admit: 2015-01-09 | Discharge: 2015-01-09 | Disposition: A | Discharge status: Home / Self Care | Payer: 59 | Source: Ambulatory Visit | Attending: Internal Medicine | Admitting: Internal Medicine

## 2015-01-09 ENCOUNTER — Other Ambulatory Visit: Payer: Self-pay | Admitting: Internal Medicine

## 2015-01-09 DIAGNOSIS — R413 Other amnesia: Secondary | ICD-10-CM | POA: Diagnosis not present

## 2015-01-09 DIAGNOSIS — Z1231 Encounter for screening mammogram for malignant neoplasm of breast: Secondary | ICD-10-CM

## 2015-01-09 DIAGNOSIS — Z780 Asymptomatic menopausal state: Secondary | ICD-10-CM

## 2015-01-09 DIAGNOSIS — M85852 Other specified disorders of bone density and structure, left thigh: Secondary | ICD-10-CM | POA: Diagnosis not present

## 2015-01-09 DIAGNOSIS — R29898 Other symptoms and signs involving the musculoskeletal system: Secondary | ICD-10-CM

## 2015-01-09 LAB — HM DEXA SCAN

## 2015-01-09 MED ORDER — GADOBENATE DIMEGLUMINE 529 MG/ML IV SOLN
13.0000 mL | Freq: Once | INTRAVENOUS | Status: AC | PRN
  Administered 2015-01-09: 13 mL via INTRAVENOUS

## 2015-01-10 ENCOUNTER — Other Ambulatory Visit: Payer: Self-pay | Admitting: Internal Medicine

## 2015-01-10 DIAGNOSIS — R928 Other abnormal and inconclusive findings on diagnostic imaging of breast: Secondary | ICD-10-CM

## 2015-01-13 ENCOUNTER — Encounter: Payer: Self-pay | Admitting: *Deleted

## 2015-01-18 ENCOUNTER — Ambulatory Visit (INDEPENDENT_AMBULATORY_CARE_PROVIDER_SITE_OTHER): Payer: 59 | Admitting: Internal Medicine

## 2015-01-18 ENCOUNTER — Encounter: Payer: Self-pay | Admitting: Internal Medicine

## 2015-01-18 VITALS — HR 52 | Temp 98.0°F | Wt 145.0 lb

## 2015-01-18 DIAGNOSIS — I10 Essential (primary) hypertension: Secondary | ICD-10-CM

## 2015-01-18 DIAGNOSIS — M8580 Other specified disorders of bone density and structure, unspecified site: Secondary | ICD-10-CM | POA: Diagnosis not present

## 2015-01-18 DIAGNOSIS — K219 Gastro-esophageal reflux disease without esophagitis: Secondary | ICD-10-CM | POA: Diagnosis not present

## 2015-01-18 DIAGNOSIS — I6990 Unspecified sequelae of unspecified cerebrovascular disease: Secondary | ICD-10-CM | POA: Diagnosis not present

## 2015-01-18 MED ORDER — METOPROLOL SUCCINATE ER 25 MG PO TB24: 12.5000 mg | ORAL_TABLET | Freq: Every day | ORAL | Status: DC

## 2015-01-18 MED ORDER — AMLODIPINE BESYLATE 5 MG PO TABS: ORAL_TABLET | ORAL | Status: DC

## 2015-01-18 MED ORDER — DEXLANSOPRAZOLE 60 MG PO CPDR: DELAYED_RELEASE_CAPSULE | ORAL | Status: DC

## 2015-01-18 NOTE — Patient Instructions (Addendum)
Recommend low impact exercises (like yoga) to strengthen muscles/bones. Start Oscal D or Caltrate D twice daily  Continue current medications as prescribed.  F/u with Dr Glade LloydPandey in 3 mos  Keep breast ultrasound appt next week.

## 2015-01-18 NOTE — Progress Notes (Signed)
Patient ID: [name], female   DOB: 05-03-1971, 44 y.o.   MRN: [id number]    Facility  [facility]    Place of Service:   OFFICE   Allergies  Allergen Reactions  . Naprosyn [Naproxen]   . Risperdal [Risperidone]   . Seroquel [Quetiapine Fumarate]     Chief Complaint  Patient presents with  . Follow-up    Discuss abnormal labs    HPI:  44 yo female seen today to discuss imaging and lab results. She maintains healthy lifestyle and [facility] [employer]   Medications: Patient's Medications  New Prescriptions   No medications on file  Previous Medications   ALBUTEROL (PROVENTIL HFA;VENTOLIN HFA) 108 (90 BASE) MCG/ACT INHALER    Inhale 2 puffs into the lungs every 6 (six) hours as needed for wheezing or shortness of breath.   AMLODIPINE (NORVASC) 5 MG TABLET    Take one tablet once daily for blood pressure and heart   ASPIRIN 81 MG TABLET    Take 1 tablet (81 mg total) by mouth daily.   CALCIUM CARBONATE (OS-CAL) 600 MG TABS TABLET    Take 600 mg by mouth 2 (two) times daily with a meal. With 400 iu vit D   CITALOPRAM (CELEXA) 20 MG TABLET    Take 1 tablet (20 mg total) by mouth daily.   CLOPIDOGREL (PLAVIX) 75 MG TABLET    Take one tablet once daily   DEXLANSOPRAZOLE (DEXILANT) 60 MG CAPSULE    TAKE ONE CAPSULE BY MOUTH EVERY DAY   GABAPENTIN (NEURONTIN) 100 MG CAPSULE    Take one capsule three times daily for pains   LISINOPRIL (PRINIVIL,ZESTRIL) 20 MG TABLET    Take one tablet by mouth once daily for blood pressure   LUBIPROSTONE (AMITIZA) 8 MCG CAPSULE    TAKE ONE CAPSULE BY MOUTH TWICE DAILY FOR  CONSTIPATION   METOPROLOL SUCCINATE (TOPROL-XL) 25 MG 24 HR TABLET    Take 0.5 tablets (12.5 mg total) by mouth daily.   PRAVASTATIN (PRAVACHOL) 10 MG TABLET    Take 1 tablet (10 mg total) by mouth daily.   RANITIDINE (ZANTAC) 150 MG TABLET    Take 1 tablet (150 mg total) by mouth 2 (two) times daily.   TRAMADOL (ULTRAM) 50 MG TABLET    Take one to two tablets by mouth  every 6 hours as needed for pain as directed (one tablet for pain 1-5/10 and two tablets for pain 6-10/10)   ZOLPIDEM (AMBIEN) 5 MG TABLET    Take 1 tablet (5 mg total) by mouth at bedtime as needed for sleep.  Modified Medications   No medications on file  Discontinued Medications   METRONIDAZOLE (FLAGYL ER) 750 MG 24 HR TABLET    Take 1 tablet (750 mg total) by mouth daily.     Review of Systems As above. All other systems reviewed are negative.  Filed Vitals:   01/18/15 1059  Pulse: 52  Temp: 98 F (36.7 C)  TempSrc: Oral  Weight: 145 lb (65.772 kg)  SpO2: 98%   Body mass index is 24.88 kg/(m^2).  Physical Exam CONSTITUTIONAL: Looks well in NAD. Awake, alert and oriented x 3 HEENT: PERRLA. Oropharynx clear and without exudate NECK: Supple. Nontender. No palpable cervical or supraclavicular lymph nodes. No carotid bruit b/l. No thyromegaly or thyroid mass palpable.  CVS: Regular rate without murmur, gallop or rub. LUNGS: CTA b/l no wheezing, rales or rhonchi. EXTREMITIES: No edema b/l. Distal pulses palpable. No  calf tenderness PSYCH: Affect, behavior and mood normal   Labs reviewed: Abstract on 01/13/2015  Component Date Value Ref Range Status  . HM Dexa Scan 01/09/2015 [facility] D[facility]   Final  Office Visit on 12/20/2014  Component Date Value Ref Range Status  . DIAGNOSIS: 12/20/2014 Comment   Final   Comment: NEGATIVE FOR INTRAEPITHELIAL LESION AND MALIGNANCY. CELLULAR CHANGES ASSOCIATED WITH INFLAMMATION ARE PRESENT. TRICHOMONAS VAGINALIS IS PRESENT.   Marland Kitchen Specimen adequacy: 12/20/2014 Comment   Final   Satisfactory for evaluation. No endocervical component is identified.  Marland Kitchen CLINICIAN PROVIDED ICD10: 12/20/2014 Comment   Final   Comment: V76.2 Z12.4   . Performed by: 12/20/2014 Comment   Final   Reginia Naas, Cytotechnologist (ASCP)  . PAP SMEAR COMMENT 12/20/2014 .   Final  . Note: 12/20/2014 Comment   Final   Comment: The Pap smear is a  screening test designed to aid in the detection of premalignant and malignant conditions of the uterine cervix.  It is not a diagnostic procedure and should not be used as the sole means of detecting cervical cancer.  Both false-positive and false-negative reports do occur.   . Test Methodology 12/20/2014 Comment   Final   Comment: This liquid based ThinPrep(R) pap test was screened with the use of an image guided system.   Appointment on 12/15/2014  Component Date Value Ref Range Status  . WBC 12/15/2014 8.7  3.4 - 10.8 x10E3/uL Final  . RBC 12/15/2014 4.47  3.77 - 5.28 x10E6/uL Final  . Hemoglobin 12/15/2014 14.1  11.1 - 15.9 g/dL Final  . HCT 16/09/9603 41.4  34.0 - 46.6 % Final  . MCV 12/15/2014 93  79 - 97 fL Final  . MCH 12/15/2014 31.5  26.6 - 33.0 pg Final  . MCHC 12/15/2014 34.1  31.5 - 35.7 g/dL Final  . RDW 54/08/8118 12.3  12.3 - 15.4 % Final  . Neutrophils Relative % 12/15/2014 45   Final  . Lymphs 12/15/2014 41   Final  . Monocytes 12/15/2014 9   Final  . Eos 12/15/2014 4   Final  . Basos 12/15/2014 1   Final  . Neutrophils Absolute 12/15/2014 3.9  1.4 - 7.0 x10E3/uL Final  . Lymphocytes Absolute 12/15/2014 3.6* 0.7 - 3.1 x10E3/uL Final  . Monocytes Absolute 12/15/2014 0.8  0.1 - 0.9 x10E3/uL Final  . Eosinophils Absolute 12/15/2014 0.3  0.0 - 0.4 x10E3/uL Final  . Basophils Absolute 12/15/2014 0.1  0.0 - 0.2 x10E3/uL Final  . Immature Granulocytes 12/15/2014 0   Final  . Immature Grans (Abs) 12/15/2014 0.0  0.0 - 0.1 x10E3/uL Final  . Cholesterol, Total 12/15/2014 114  100 - 199 mg/dL Final  . Triglycerides 12/15/2014 68  0 - 149 mg/dL Final  . HDL 14/78/2956 59  >39 mg/dL Final   Comment: According to ATP-III Guidelines, HDL-C >59 mg/dL is considered a negative risk factor for CHD.   Marland Kitchen VLDL Cholesterol Cal 12/15/2014 14  5 - 40 mg/dL Final  . LDL Calculated 12/15/2014 41  0 - 99 mg/dL Final  . Chol/HDL Ratio 12/15/2014 1.9  0.0 - 4.4 ratio units Final    Comment:                                   T. Chol/HDL Ratio  Men  Women                               1/2 Avg.Risk  3.4    3.3                                   Avg.Risk  5.0    4.4                                2X Avg.Risk  9.6    7.1                                3X Avg.Risk 23.4   11.0   . Glucose 12/15/2014 92  65 - 99 mg/dL Final  . BUN 91/47/829512/17/2015 7  6 - 24 mg/dL Final  . Creatinine, Ser 12/15/2014 0.72  0.57 - 1.00 mg/dL Final  . GFR calc non Af Amer 12/15/2014 102  >59 mL/min/1.73 Final  . GFR calc Af Amer 12/15/2014 118  >59 mL/min/1.73 Final  . BUN/Creatinine Ratio 12/15/2014 10  9 - 23 Final  . Sodium 12/15/2014 139  134 - 144 mmol/L Final  . Potassium 12/15/2014 4.0  3.5 - 5.2 mmol/L Final  . Chloride 12/15/2014 105  97 - 108 mmol/L Final  . CO2 12/15/2014 23  18 - 29 mmol/L Final  . Calcium 12/15/2014 9.2  8.7 - 10.2 mg/dL Final  . Total Protein 12/15/2014 6.9  6.0 - 8.5 g/dL Final  . Albumin 62/13/086512/17/2015 4.1  3.5 - 5.5 g/dL Final  . Globulin, Total 12/15/2014 2.8  1.5 - 4.5 g/dL Final  . Albumin/Globulin Ratio 12/15/2014 1.5  1.1 - 2.5 Final  . Total Bilirubin 12/15/2014 0.9  0.0 - 1.2 mg/dL Final  . Alkaline Phosphatase 12/15/2014 66  39 - 117 IU/L Final  . AST 12/15/2014 11  0 - 40 IU/L Final  . ALT 12/15/2014 8  0 - 32 IU/L Final  . TSH 12/15/2014 1.690  0.450 - 4.500 uIU/mL Final  . Vit D, 1,25-Dihydroxy 12/15/2014 61.9  19.9 - 79.3 pg/mL Final   CLINICAL DATA: 44 year old female with premature menopause at age 44. Right humerus fracture February 2011. Long-term use of gabapentin. Not currently taking supplemental calcium or vitamin-D.  EXAM: DUAL X-RAY ABSORPTIOMETRY (DXA) FOR BONE MINERAL DENSITY  FINDINGS: AP LUMBAR SPINE L1-L4  Bone Mineral Density (BMD): 1.066 G/cm2  Young Adult T-Score: 0.2  Z-Score: -0.3  Left FEMUR neck  Bone Mineral Density (BMD): 0.667 g/cm2  Young Adult T-Score:  -1.6  Z-Score: -1.8  ASSESSMENT: Patient's diagnostic category is low bone mass by WHO Criteria.  FRACTURE RISK: Moderate  FRAX: Based on the World Health Organization FRAX model, the 10 year probability of a major osteoporotic fracture is 2.8%. The 10 year probability of a hip fracture is 0.3%.  Assessment/Plan   ICD-9-CM ICD-10-CM   1. Osteopenia 733.90 M85.80   2. Essential hypertension, benign 401.1 I10 metoprolol succinate (TOPROL-XL) 25 MG 24 hr tablet     amLODipine (NORVASC) 5 MG tablet  3. Late effects of CVA (cerebrovascular accident) 438.9 I69.90   4. Gastroesophageal reflux disease, esophagitis presence not specified 530.81 K21.9    --DXA results reviewed with pt. Repeat DXA in 7235yrs. Take Calcium with  D BID. Low impact exercises  --meds RF as above  --f/u breast US as scheduled  --RTO in 3 mos to see Dr Lewie Loron. Ancil Linsey  [facility] [address] [place], [place] [address] [phone] Office (Wednesdays and Fridays 8 AM - 5 PM) [phone] Cell (Monday-Friday 8 AM - 5 PM)   --keep appt with Dr Glade Lloyd as scheduled

## 2015-01-23 ENCOUNTER — Other Ambulatory Visit: Payer: 59

## 2015-01-31 ENCOUNTER — Ambulatory Visit
Admission: RE | Admit: 2015-01-31 | Discharge: 2015-01-31 | Disposition: A | Discharge status: Home / Self Care | Payer: 59 | Source: Ambulatory Visit | Attending: Internal Medicine | Admitting: Internal Medicine

## 2015-01-31 DIAGNOSIS — R922 Inconclusive mammogram: Secondary | ICD-10-CM | POA: Diagnosis not present

## 2015-01-31 DIAGNOSIS — R928 Other abnormal and inconclusive findings on diagnostic imaging of breast: Secondary | ICD-10-CM

## 2015-03-02 ENCOUNTER — Other Ambulatory Visit: Payer: Self-pay | Admitting: Internal Medicine

## 2015-03-10 ENCOUNTER — Telehealth: Payer: Self-pay | Admitting: *Deleted

## 2015-03-10 NOTE — Telephone Encounter (Signed)
Prior Authorization done for Zolpidem Tartrate through Cover My Meds. Determination will be made within 48-72 hours by fax. Ref#: [id number]

## 2015-03-10 NOTE — Telephone Encounter (Signed)
Prior Authorization done for Dexilant through Cover My Meds. Determination to be made within 48-72 hours Ref #: [id number]

## 2015-04-12 ENCOUNTER — Emergency Department (HOSPITAL_COMMUNITY)
Admission: EM | Admit: 2015-04-12 | Discharge: 2015-04-12 | Disposition: A | Discharge status: Home / Self Care | Payer: Medicaid Other | Attending: Emergency Medicine | Admitting: Emergency Medicine

## 2015-04-12 ENCOUNTER — Emergency Department (HOSPITAL_COMMUNITY): Payer: Medicaid Other

## 2015-04-12 ENCOUNTER — Encounter (HOSPITAL_COMMUNITY): Payer: Self-pay | Admitting: Physical Medicine and Rehabilitation

## 2015-04-12 DIAGNOSIS — Z862 Personal history of diseases of the blood and blood-forming organs and certain disorders involving the immune mechanism: Secondary | ICD-10-CM | POA: Insufficient documentation

## 2015-04-12 DIAGNOSIS — E669 Obesity, unspecified: Secondary | ICD-10-CM | POA: Diagnosis not present

## 2015-04-12 DIAGNOSIS — Z79899 Other long term (current) drug therapy: Secondary | ICD-10-CM | POA: Insufficient documentation

## 2015-04-12 DIAGNOSIS — I10 Essential (primary) hypertension: Secondary | ICD-10-CM | POA: Diagnosis present

## 2015-04-12 DIAGNOSIS — R200 Anesthesia of skin: Secondary | ICD-10-CM | POA: Diagnosis present

## 2015-04-12 DIAGNOSIS — Z7982 Long term (current) use of aspirin: Secondary | ICD-10-CM | POA: Diagnosis not present

## 2015-04-12 DIAGNOSIS — F319 Bipolar disorder, unspecified: Secondary | ICD-10-CM | POA: Insufficient documentation

## 2015-04-12 DIAGNOSIS — G5711 Meralgia paresthetica, right lower limb: Secondary | ICD-10-CM | POA: Diagnosis present

## 2015-04-12 DIAGNOSIS — G40909 Epilepsy, unspecified, not intractable, without status epilepticus: Secondary | ICD-10-CM | POA: Diagnosis not present

## 2015-04-12 DIAGNOSIS — Z8739 Personal history of other diseases of the musculoskeletal system and connective tissue: Secondary | ICD-10-CM | POA: Insufficient documentation

## 2015-04-12 DIAGNOSIS — Z8709 Personal history of other diseases of the respiratory system: Secondary | ICD-10-CM | POA: Insufficient documentation

## 2015-04-12 DIAGNOSIS — E785 Hyperlipidemia, unspecified: Secondary | ICD-10-CM | POA: Diagnosis not present

## 2015-04-12 DIAGNOSIS — G609 Hereditary and idiopathic neuropathy, unspecified: Secondary | ICD-10-CM | POA: Insufficient documentation

## 2015-04-12 DIAGNOSIS — K210 Gastro-esophageal reflux disease with esophagitis: Secondary | ICD-10-CM | POA: Diagnosis not present

## 2015-04-12 DIAGNOSIS — Z7902 Long term (current) use of antithrombotics/antiplatelets: Secondary | ICD-10-CM | POA: Insufficient documentation

## 2015-04-12 DIAGNOSIS — Z3202 Encounter for pregnancy test, result negative: Secondary | ICD-10-CM | POA: Diagnosis not present

## 2015-04-12 DIAGNOSIS — Z8673 Personal history of transient ischemic attack (TIA), and cerebral infarction without residual deficits: Secondary | ICD-10-CM | POA: Diagnosis not present

## 2015-04-12 DIAGNOSIS — I509 Heart failure, unspecified: Secondary | ICD-10-CM | POA: Insufficient documentation

## 2015-04-12 DIAGNOSIS — Z87891 Personal history of nicotine dependence: Secondary | ICD-10-CM | POA: Diagnosis not present

## 2015-04-12 DIAGNOSIS — G629 Polyneuropathy, unspecified: Secondary | ICD-10-CM

## 2015-04-12 LAB — COMPREHENSIVE METABOLIC PANEL
ALK PHOS: 62 U/L (ref 39–117)
ALT: 10 U/L (ref 0–35)
ANION GAP: 9 (ref 5–15)
AST: 13 U/L (ref 0–37)
Albumin: 3.7 g/dL (ref 3.5–5.2)
BILIRUBIN TOTAL: 1.1 mg/dL (ref 0.3–1.2)
BUN: 9 mg/dL (ref 6–23)
CO2: 24 mmol/L (ref 19–32)
Calcium: 9.2 mg/dL (ref 8.4–10.5)
Chloride: 105 mmol/L (ref 96–112)
Creatinine, Ser: 0.71 mg/dL (ref 0.50–1.10)
GLUCOSE: 96 mg/dL (ref 70–99)
POTASSIUM: 3.5 mmol/L (ref 3.5–5.1)
Sodium: 138 mmol/L (ref 135–145)
Total Protein: 7.7 g/dL (ref 6.0–8.3)

## 2015-04-12 LAB — CBC WITH DIFFERENTIAL/PLATELET
BASOS PCT: 1 % (ref 0–1)
Basophils Absolute: 0.1 10*3/uL (ref 0.0–0.1)
EOS ABS: 0.2 10*3/uL (ref 0.0–0.7)
EOS PCT: 3 % (ref 0–5)
HEMATOCRIT: 44 % (ref 36.0–46.0)
Hemoglobin: 15 g/dL (ref 12.0–15.0)
Lymphocytes Relative: 40 % (ref 12–46)
Lymphs Abs: 2.4 10*3/uL (ref 0.7–4.0)
MCH: 31.2 pg (ref 26.0–34.0)
MCHC: 34.1 g/dL (ref 30.0–36.0)
MCV: 91.5 fL (ref 78.0–100.0)
MONO ABS: 0.6 10*3/uL (ref 0.1–1.0)
Monocytes Relative: 9 % (ref 3–12)
Neutro Abs: 2.8 10*3/uL (ref 1.7–7.7)
Neutrophils Relative %: 47 % (ref 43–77)
Platelets: 355 10*3/uL (ref 150–400)
RBC: 4.81 MIL/uL (ref 3.87–5.11)
RDW: 11.2 % — ABNORMAL LOW (ref 11.5–15.5)
WBC: 5.9 10*3/uL (ref 4.0–10.5)

## 2015-04-12 LAB — APTT: aPTT: 27 seconds (ref 24–37)

## 2015-04-12 LAB — URINALYSIS, ROUTINE W REFLEX MICROSCOPIC
Bilirubin Urine: NEGATIVE
Glucose, UA: NEGATIVE mg/dL
Hgb urine dipstick: NEGATIVE
Ketones, ur: 15 mg/dL — AB
Leukocytes, UA: NEGATIVE
NITRITE: NEGATIVE
PH: 5 (ref 5.0–8.0)
Protein, ur: NEGATIVE mg/dL
SPECIFIC GRAVITY, URINE: 1.027 (ref 1.005–1.030)
Urobilinogen, UA: 0.2 mg/dL (ref 0.0–1.0)

## 2015-04-12 LAB — RAPID URINE DRUG SCREEN, HOSP PERFORMED
Amphetamines: NOT DETECTED
Barbiturates: NOT DETECTED
Benzodiazepines: NOT DETECTED
Cocaine: NOT DETECTED
OPIATES: NOT DETECTED
Tetrahydrocannabinol: POSITIVE — AB

## 2015-04-12 LAB — I-STAT TROPONIN, ED: Troponin i, poc: 0 ng/mL (ref 0.00–0.08)

## 2015-04-12 LAB — PROTIME-INR
INR: 0.98 (ref 0.00–1.49)
PROTHROMBIN TIME: 13.1 s (ref 11.6–15.2)

## 2015-04-12 LAB — PREGNANCY, URINE: Preg Test, Ur: NEGATIVE

## 2015-04-12 MED ORDER — OXYCODONE-ACETAMINOPHEN 5-325 MG PO TABS
2.0000 | ORAL_TABLET | Freq: Once | ORAL | Status: AC
  Administered 2015-04-12: 2 via ORAL
  Filled 2015-04-12: qty 2

## 2015-04-12 MED ORDER — MORPHINE SULFATE 4 MG/ML IJ SOLN: 4.0000 mg | Freq: Once | INTRAMUSCULAR | Status: DC

## 2015-04-12 MED ORDER — GABAPENTIN 100 MG PO CAPS: 100.0000 mg | ORAL_CAPSULE | Freq: Three times a day (TID) | ORAL | Status: DC

## 2015-04-12 NOTE — ED Provider Notes (Signed)
CSN: [id number]     Arrival date & time 04/12/15  1520 History   First MD Initiated Contact with Patient 04/12/15 1703     Chief Complaint  Patient presents with  . Numbness     (Consider location/radiation/quality/duration/timing/severity/associated sxs/prior Treatment) HPI  44 year old female presents with right leg weakness/heaviness and numbness/tingling. Started around 1 AM. Has a history of a prior stroke 4 years ago which involved her right arm and right leg and states this feels similar. After physical therapy she was able to get her leg and arms better but not quite back to baseline. However this is worse. His been worsening since onset. Had a gradual onset headache 2 days ago that is also been worsening. No back pain. No urinary symptoms. No arm symptoms or speech symptoms. Has issues with neuropathy in RLE that she takes gabapentin for.  Past Medical History  Diagnosis Date  . Bipolar disorder   . CHF (congestive heart failure)   . Hypertension   . Seizure disorder   . Unspecified constipation   . Unspecified vitamin D deficiency   . Unspecified late effects of cerebrovascular disease   . Allergic rhinitis due to pollen   . Other malaise and fatigue   . Sarcoidosis   . Other and unspecified hyperlipidemia   . Osteoporosis, unspecified   . Cocaine dependence 2012  . Other and unspecified hyperlipidemia   . Other convulsions   . Other abnormal blood chemistry   . Unspecified sleep apnea   . Obesity, unspecified   . Tobacco use disorder     stopped smoking  . Depressive disorder, not elsewhere classified   . Unspecified hereditary and idiopathic peripheral neuropathy   . Reflux esophagitis   . Allergic rhinitis due to pollen    Past Surgical History  Procedure Laterality Date  . Cholecystectomy    . Lung biopsy    . Fracture surgery      right arm   Family History  Problem Relation Age of Onset  . Diabetes Mother   . Hypertension Mother   . Hypertension  Father   . Stroke Father   . Cancer Paternal Uncle     breast   History  Substance Use Topics  . Smoking status: Former Smoker    Quit date: 05/30/2013  . Smokeless tobacco: Not on file  . Alcohol Use: No   OB History    No data available     Review of Systems  Constitutional: Negative for fever.  Respiratory: Negative for shortness of breath.   Cardiovascular: Negative for chest pain.  Musculoskeletal: Negative for back pain.  Neurological: Positive for weakness, numbness and headaches.  All other systems reviewed and are negative.     Allergies  Risperdal; Seroquel; and Naprosyn  Home Medications   Prior to Admission medications   Medication Sig Start Date End Date Taking? Authorizing Provider  aspirin 81 MG tablet Take 1 tablet (81 mg total) by mouth daily. 04/05/14  Yes Mahima Pandey, MD  albuterol (PROVENTIL HFA;VENTOLIN HFA) 108 (90 BASE) MCG/ACT inhaler Inhale 2 puffs into the lungs every 6 (six) hours as needed for wheezing or shortness of breath. Patient not taking: Reported on 04/12/2015 08/09/14   Oneal Grout, MD  amLODipine (NORVASC) 5 MG tablet Take one tablet once daily for blood pressure and heart Patient not taking: Reported on 04/12/2015 01/18/15   Kirt Boys, DO  citalopram (CELEXA) 20 MG tablet Take 1 tablet (20 mg total) by mouth daily. Patient not taking:  Reported on 04/12/2015 12/20/14   Oneal Grout, MD  clopidogrel (PLAVIX) 75 MG tablet Take one tablet once daily Patient not taking: Reported on 04/12/2015 04/05/14   Oneal Grout, MD  dexlansoprazole (DEXILANT) 60 MG capsule TAKE ONE CAPSULE BY MOUTH EVERY DAY Patient not taking: Reported on 04/12/2015 01/18/15   Kirt Boys, DO  gabapentin (NEURONTIN) 100 MG capsule Take one capsule three times daily for pains Patient not taking: Reported on 04/12/2015 04/05/14   Oneal Grout, MD  lisinopril (PRINIVIL,ZESTRIL) 20 MG tablet Take one tablet by mouth once daily for blood pressure Patient not taking:  Reported on 04/12/2015 08/09/14   Oneal Grout, MD  lubiprostone (AMITIZA) 8 MCG capsule TAKE ONE CAPSULE BY MOUTH TWICE DAILY FOR  CONSTIPATION Patient not taking: Reported on 04/12/2015 04/27/14   Oneal Grout, MD  metoprolol succinate (TOPROL-XL) 25 MG 24 hr tablet Take 0.5 tablets (12.5 mg total) by mouth daily. Patient not taking: Reported on 04/12/2015 01/18/15   Kirt Boys, DO  pravastatin (PRAVACHOL) 10 MG tablet Take 1 tablet (10 mg total) by mouth daily. Patient not taking: Reported on 04/12/2015 08/09/14   Oneal Grout, MD  ranitidine (ZANTAC) 150 MG tablet Take 1 tablet (150 mg total) by mouth 2 (two) times daily. Patient not taking: Reported on 04/12/2015 12/20/14   Oneal Grout, MD  traMADol (ULTRAM) 50 MG tablet TAKE ONE TABLET BY MOUTH EVERY 6 HOURS AS NEEDED FOR PAIN AS DIRECTED (ONE  TABLET  FOR  PAIN  1-5/10  AND  2  TABLETS  FOR  PAIN  6-10/10) Patient not taking: Reported on 04/12/2015 03/03/15   Kirt Boys, DO  zolpidem (AMBIEN) 5 MG tablet Take 1 tablet (5 mg total) by mouth at bedtime as needed for sleep. Patient not taking: Reported on 04/12/2015 12/02/14   Tiffany L Reed, DO   BP 169/89 mmHg  Pulse 60  Temp(Src) 97.9 F (36.6 C)  Resp 18  Ht  (1.6 m)  Wt 140 lb (63.504 kg)  BMI 24.81 kg/m2  SpO2 97% Physical Exam  Constitutional: She is oriented to person, place, and time. She appears well-developed and well-nourished.  HENT:  Head: Normocephalic and atraumatic.  Right Ear: External ear normal.  Left Ear: External ear normal.  Nose: Nose normal.  Eyes: EOM are normal. Pupils are equal, round, and reactive to light. Right eye exhibits no discharge. Left eye exhibits no discharge.  Cardiovascular: Normal rate, regular rhythm and normal heart sounds.   Pulmonary/Chest: Effort normal and breath sounds normal.  Abdominal: Soft. There is no tenderness.  Neurological: She is alert and oriented to person, place, and time.  CN 2-12 grossly intact. 5/5 strength in  RUE, LUE, LLE. RLE has mild decreased strength but also appears to be pain related. Initially nurse lifting up patient's heel to keep foot in air and patient was pushing her thigh down. When this was stopped she can keep the extremity in air for 10 seconds but appears to have significant pain.   Skin: Skin is warm and dry.  Vitals reviewed.   ED Course  Procedures (including critical care time) Labs Review Labs Reviewed  CBC WITH DIFFERENTIAL/PLATELET - Abnormal; Notable for the following:    RDW 11.2 (*)    All other components within normal limits  URINALYSIS, ROUTINE W REFLEX MICROSCOPIC - Abnormal; Notable for the following:    Color, Urine AMBER (*)    Ketones, ur 15 (*)    All other components within normal limits  URINE RAPID  DRUG SCREEN (HOSP PERFORMED) - Abnormal; Notable for the following:    Tetrahydrocannabinol POSITIVE (*)    All other components within normal limits  COMPREHENSIVE METABOLIC PANEL  PROTIME-INR  APTT  PREGNANCY, URINE  I-STAT TROPOININ, ED    Imaging Review Ct Head Wo Contrast  04/12/2015   CLINICAL DATA:  Right leg numbness and pain for 1 day  EXAM: CT HEAD WITHOUT CONTRAST  TECHNIQUE: Contiguous axial images were obtained from the base of the skull through the vertex without intravenous contrast.  COMPARISON:  Head CT August 30, 2011 and brain MRI January 09, 2015  FINDINGS: The ventricles are normal in size and configuration. There is no intracranial mass, hemorrhage, extra-axial fluid collection, or midline shift. In comparison with the prior studies, there is a new focus of decreased attenuation in the rostrum of the corpus callosum on the left. There is slight decreased attenuation adjacent to the frontal horns of the lateral ventricles. Elsewhere gray-white compartments appear normal. The bony calvarium appears intact. The mastoid air cells are clear.  IMPRESSION: Focal decreased attenuation in the rostrum of the left corpus callosum. This finding  potentially could represent a focal infarct in this area, age uncertain. This finding also, however, could represent demyelination. Multiple sclerosis must be of concern given this appearance as well as the white-matter lesions seen on recent prior MR. no other new gray-white compartment lesion is identified. No hemorrhage or mass effect.   Electronically Signed   By: Bretta BangWilliam  Woodruff III M.D.   On: 04/12/2015 18:48   Mr Brain Wo Contrast (neuro Protocol)  04/12/2015   CLINICAL DATA:  Acute onset of right leg tingling and numbness beginning at 1 a.m. last night. Personal history of CVA.  EXAM: MRI HEAD WITHOUT CONTRAST  TECHNIQUE: Multiplanar, multiecho pulse sequences of the brain and surrounding structures were obtained without intravenous contrast.  COMPARISON:  CT head without contrast 04/12/2015. MRI brain 01/09/2015.  FINDINGS: The diffusion-weighted images demonstrate no evidence for acute or subacute infarction. The a remote white matter infarct is present in the posterior left coronal radiata. A 10 mm T2 hyperintensity along the anterior genu of the corpus callosum on the left is new since the prior study. Periventricular white matter changes are otherwise stable.  The ventricles are of normal size. No significant extraaxial fluid collection is present.  Flow is present in the major intracranial arteries. The globes orbits are intact. The paranasal sinuses and mastoid air cells are clear. The skullbase is normal. Midline structures demonstrate prominent adenoid tissue. Decreased T1 marrow signal is noted in the upper cervical spine.  IMPRESSION: 1. New T2 hyperintensity along the anterior genu of the corpus callosum on the left. This could be related to in interval infarct. There is no acute infarct or restricted diffusion. Alternatively, this could be a demyelinating lesion. 2. Periventricular white matter changes bilaterally. The primary differential diagnosis is premature ischemic changes versus a  demyelinating process.   Electronically Signed   By: Marin Robertshristopher  Mattern M.D.   On: 04/12/2015 21:22     EKG Interpretation None      MDM   Final diagnoses:  Lateral femoral cutaneous neuropathy, right    Discussed case with Dr. Roseanne RenoStewart of neurology who recommends MRI. MRI results reviewed with him, after his exam he feels that the patient has a peripheral neuropathy in the lateral femoral cutaneous nerve. He recommends increasing Neurontin. When talking to patient she states she's not had the Neurontin for 1 month, will start on 100  3 times a day for one week if not better increased to 200. We'll give PCP and neurology follow-up recommendations as well.    Pricilla Loveless, MD 04/13/15 [id number]

## 2015-04-12 NOTE — ED Notes (Signed)
Pt presents to department for evaluation of R leg tingling/numbness. Onset last night at 1:00am. Reports history of stroke. Ambulatory to triage. Able to move all extremities. No facial droop noted. Strong bilateral equal grip strengths. Pt is alert and oriented x4.

## 2015-04-12 NOTE — ED Notes (Signed)
Pt to CT at this time.  Pt remains alert and oriented x's 3 °

## 2015-04-12 NOTE — Discharge Instructions (Signed)
Neuropathic Pain °We often think that pain has a physical cause. If we get rid of the cause, the pain should go away. Nerves themselves can also cause pain. It is called neuropathic pain, which means nerve abnormality. It may be difficult for the patients who have it and for the treating caregivers. Pain is usually described as acute (short-lived) or chronic (long-lasting). Acute pain is related to the physical sensations caused by an injury. It can last from a few seconds to many weeks, but it usually goes away when normal healing occurs. Chronic pain lasts beyond the typical healing time. With neuropathic pain, the nerve fibers themselves may be damaged or injured. They then send incorrect signals to other pain centers. The pain you feel is real, but the cause is not easy to find.  °CAUSES  °Chronic pain can result from diseases, such as diabetes and shingles (an infection related to chickenpox), or from trauma, surgery, or amputation. It can also happen without any known injury or disease. The nerves are sending pain messages, even though there is no identifiable cause for such messages.  °· Other common causes of neuropathy include diabetes, phantom limb pain, or Regional Pain Syndrome (RPS). °· As with all forms of chronic back pain, if neuropathy is not correctly treated, there can be a number of associated problems that lead to a downward cycle for the patient. These include depression, sleeplessness, feelings of fear and anxiety, limited social interaction and inability to do normal daily activities or work. °· The most dramatic and mysterious example of neuropathic pain is called "phantom limb syndrome." This occurs when an arm or a leg has been removed because of illness or injury. The brain still gets pain messages from the nerves that originally carried impulses from the missing limb. These nerves now seem to misfire and cause troubling pain. °· Neuropathic pain often seems to have no cause. It responds  poorly to standard pain treatment. °Neuropathic pain can occur after: °· Shingles (herpes zoster virus infection). °· A lasting burning sensation of the skin, caused usually by injury to a peripheral nerve. °· Peripheral neuropathy which is widespread nerve damage, often caused by diabetes or alcoholism. °· Phantom limb pain following an amputation. °· Facial nerve problems (trigeminal neuralgia). °· Multiple sclerosis. °· Reflex sympathetic dystrophy. °· Pain which comes with cancer and cancer chemotherapy. °· Entrapment neuropathy such as when pressure is put on a nerve such as in carpal tunnel syndrome. °· Back, leg, and hip problems (sciatica). °· Spine or back surgery. °· HIV Infection or AIDS where nerves are infected by viruses. °Your caregiver can explain items in the above list which may apply to you. °SYMPTOMS  °Characteristics of neuropathic pain are: °· Severe, sharp, electric shock-like, shooting, lightening-like, knife-like. °· Pins and needles sensation. °· Deep burning, deep cold, or deep ache. °· Persistent numbness, tingling, or weakness. °· Pain resulting from light touch or other stimulus that would not usually cause pain. °· Increased sensitivity to something that would normally cause pain, such as a pinprick. °Pain may persist for months or years following the healing of damaged tissues. When this happens, pain signals no longer sound an alarm about current injuries or injuries about to happen. Instead, the alarm system itself is not working correctly.  °Neuropathic pain may get worse instead of better over time. For some people, it can lead to serious disability. It is important to be aware that severe injury in a limb can occur without a proper, protective pain   response. Burns, cuts, and other injuries may go unnoticed. Without proper treatment, these injuries can become infected or lead to further disability. Take any injury seriously, and consult your caregiver for treatment. °DIAGNOSIS    °When you have a pain with no known cause, your caregiver will probably ask some specific questions:  °· Do you have any other conditions, such as diabetes, shingles, multiple sclerosis, or HIV infection? °· How would you describe your pain? (Neuropathic pain is often described as shooting, stabbing, burning, or searing.) °· Is your pain worse at any time of the day? (Neuropathic pain is usually worse at night.) °· Does the pain seem to follow a certain physical pathway? °· Does the pain come from an area that has missing or injured nerves? (An example would be phantom limb pain.) °· Is the pain triggered by minor things such as rubbing against the sheets at night? °These questions often help define the type of pain involved. Once your caregiver knows what is happening, treatment can begin. Anticonvulsant, antidepressant drugs, and various pain relievers seem to work in some cases. If another condition, such as diabetes is involved, better management of that disorder may relieve the neuropathic pain.  °TREATMENT  °Neuropathic pain is frequently long-lasting and tends not to respond to treatment with narcotic type pain medication. It may respond well to other drugs such as antiseizure and antidepressant medications. Usually, neuropathic problems do not completely go away, but partial improvement is often possible with proper treatment. Your caregivers have large numbers of medications available to treat you. Do not be discouraged if you do not get immediate relief. Sometimes different medications or a combination of medications will be tried before you receive the results you are hoping for. See your caregiver if you have pain that seems to be coming from nowhere and does not go away. Help is available.  °SEEK IMMEDIATE MEDICAL CARE IF:  °· There is a sudden change in the quality of your pain, especially if the change is on only one side of the body. °· You notice changes of the skin, such as redness, black or  purple discoloration, swelling, or an ulcer. °· You cannot move the affected limbs. °Document Released: 09/12/2004 Document Revised: 03/09/2012 Document Reviewed: 09/12/2004 °[employer]® Patient Information ©2015 [employer]. This information is not intended to replace advice given to you by your health care provider. Make sure you discuss any questions you have with your health care provider. ° °Peripheral Neuropathy °Peripheral neuropathy is a type of nerve damage. It affects nerves that carry signals between the spinal cord and other parts of the body. These are called peripheral nerves. With peripheral neuropathy, one nerve or a group of nerves may be damaged.  °CAUSES  °Many things can damage peripheral nerves. For some people with peripheral neuropathy, the cause is unknown. Some causes include: °· Diabetes. This is the most common cause of peripheral neuropathy. °· Injury to a nerve. °· Pressure or stress on a nerve that lasts a long time. °· Too little vitamin B. Alcoholism can lead to this. °· Infections. °· Autoimmune diseases, such as multiple sclerosis and systemic lupus erythematosus. °· Inherited nerve diseases. °· Some medicines, such as cancer drugs. °· Toxic substances, such as lead and mercury. °· Too little blood flowing to the legs. °· Kidney disease. °· Thyroid disease. °SIGNS AND SYMPTOMS  °Different people have different symptoms. The symptoms you have will depend on which of your nerves is damaged.  Common symptoms include: °· Loss of feeling (numbness)   in the feet and hands. °· Tingling in the feet and hands. °· Pain that burns. °· Very sensitive skin. °· Weakness. °· Not being able to move a part of the body (paralysis). °· Muscle twitching. °· Clumsiness or poor coordination. °· Loss of balance. °· Not being able to control your bladder. °· Feeling dizzy. °· Sexual problems. °DIAGNOSIS  °Peripheral neuropathy is a symptom, not a disease. Finding the cause of peripheral neuropathy can be  hard. To figure that out, your health care provider will take a medical history and do a physical exam. A neurological exam will also be done. This involves checking things affected by your brain, spinal cord, and nerves (nervous system). For example, your health care provider will check your reflexes, how you move, and what you can feel.  °Other types of tests may also be ordered, such as: °· Blood tests. °· A test of the fluid in your spinal cord. °· Imaging tests, such as CT scans or an MRI. °· Electromyography (EMG). This test checks the nerves that control muscles. °· Nerve conduction velocity tests. These tests check how fast messages pass through your nerves. °· Nerve biopsy. A small piece of nerve is removed. It is then checked under a microscope. °TREATMENT  °· Medicine is often used to treat peripheral neuropathy. Medicines may include: °¨ Pain-relieving medicines. Prescription or over-the-counter medicine may be suggested. °¨ Antiseizure medicine. This may be used for pain. °¨ Antidepressants. These also may help ease pain from neuropathy. °¨ Lidocaine. This is a numbing medicine. You might wear a patch or be given a shot. °¨ Mexiletine. This medicine is typically used to help control irregular heart rhythms. °· Surgery. Surgery may be needed to relieve pressure on a nerve or to destroy a nerve that is causing pain. °· Physical therapy to help movement. °· Assistive devices to help movement. °HOME CARE INSTRUCTIONS  °· Only take over-the-counter or prescription medicines as directed by your health care provider. Follow the instructions carefully for any given medicines. Do not take any other medicines without first getting approval from your health care provider. °· If you have diabetes, work closely with your health care provider to keep your blood sugar under control. °· If you have numbness in your feet: °¨ Check every day for signs of injury or infection. Watch for redness, warmth, and  swelling. °¨ Wear padded socks and comfortable shoes. These help protect your feet. °· Do not do things that put pressure on your damaged nerve. °· Do not smoke. Smoking keeps blood from getting to damaged nerves. °· Avoid or limit alcohol. Too much alcohol can cause a lack of B vitamins. These vitamins are needed for healthy nerves. °· Develop a good support system. Coping with peripheral neuropathy can be stressful. Talk to a mental health specialist or join a support group if you are struggling. °· Follow up with your health care provider as directed. °SEEK MEDICAL CARE IF:  °· You have new signs or symptoms of peripheral neuropathy. °· You are struggling emotionally from dealing with peripheral neuropathy. °· You have a fever. °SEEK IMMEDIATE MEDICAL CARE IF:  °· You have an injury or infection that is not healing. °· You feel very dizzy or begin vomiting. °· You have chest pain. °· You have trouble breathing. °Document Released: 12/06/2002 Document Revised: 08/28/2011 Document Reviewed: 08/23/2013 °[employer]® Patient Information ©2015 [employer]. This information is not intended to replace advice given to you by your health care provider. Make sure you   discuss any questions you have with your health care provider. ° °

## 2015-04-12 NOTE — Consult Note (Signed)
Admission H&P    Chief Complaint: Tingling numbness and pain involving right lower extremity.  HPI: [name] is an 44 y.o. female with a history of hypertension, seizure disorder, sarcoidosis, hyperlipidemia, bipolar disorder and CHF, presenting with new onset tingling and numbness as well as pain involving right anterior lateral thigh. She first noticed these symptoms when she woke up at about 1:00 AM today. She has not experienced frank weakness of her right lower extremity, but has worsening of pain on standing and this minimized movement of her right lower extremity with walking. She said no symptoms involving left lower extremity nor upper extremities. She has not experienced back pain. CT scan of her head as well as MRI study showed hypodensity and T2 hyperintensity, respectively, involving the anterior chamber of the corpus callosum on the left. This lesion was age indeterminate with no signs of being acute on diffusion-weighted images of MRI.   Past Medical History  Diagnosis Date  . Bipolar disorder   . CHF (congestive heart failure)   . Hypertension   . Seizure disorder   . Unspecified constipation   . Unspecified vitamin D deficiency   . Unspecified late effects of cerebrovascular disease   . Allergic rhinitis due to pollen   . Other malaise and fatigue   . Sarcoidosis   . Other and unspecified hyperlipidemia   . Osteoporosis, unspecified   . Cocaine dependence 2012  . Other and unspecified hyperlipidemia   . Other convulsions   . Other abnormal blood chemistry   . Unspecified sleep apnea   . Obesity, unspecified   . Tobacco use disorder     stopped smoking  . Depressive disorder, not elsewhere classified   . Unspecified hereditary and idiopathic peripheral neuropathy   . Reflux esophagitis   . Allergic rhinitis due to pollen     Past Surgical History  Procedure Laterality Date  . Cholecystectomy    . Lung biopsy    . Fracture surgery      right arm     Family History  Problem Relation Age of Onset  . Diabetes Mother   . Hypertension Mother   . Hypertension Father   . Stroke Father   . Cancer Paternal Uncle     breast   Social History:  reports that she quit smoking about 22 months ago. She does not have any smokeless tobacco history on file. She reports that she does not drink alcohol or use illicit drugs.  Allergies:  Allergies  Allergen Reactions  . Risperdal [Risperidone] Anaphylaxis  . Seroquel [Quetiapine Fumarate] Other (See Comments)    Caused breast leakage  . Naprosyn [Naproxen] Rash    Indications: Patient's medications prior to admission were reviewed by me.  ROS: History obtained from the patient  General ROS: negative for - chills, fatigue, fever, night sweats, weight gain or weight loss Psychological ROS: negative for - behavioral disorder, hallucinations, memory difficulties, mood swings or suicidal ideation Ophthalmic ROS: negative for - blurry vision, double vision, eye pain or loss of vision ENT ROS: negative for - epistaxis, nasal discharge, oral lesions, sore throat, tinnitus or vertigo Allergy and Immunology ROS: negative for - hives or itchy/watery eyes Hematological and Lymphatic ROS: negative for - bleeding problems, bruising or swollen lymph nodes Endocrine ROS: negative for - galactorrhea, hair pattern changes, polydipsia/polyuria or temperature intolerance Respiratory ROS: negative for - cough, hemoptysis, shortness of breath or wheezing Cardiovascular ROS: negative for - chest pain, dyspnea on exertion, edema or irregular heartbeat Gastrointestinal  ROS: negative for - abdominal pain, diarrhea, hematemesis, nausea/vomiting or stool incontinence Genito-Urinary ROS: negative for - dysuria, hematuria, incontinence or urinary frequency/urgency Musculoskeletal ROS: negative for - joint swelling or muscular weakness Neurological ROS: as noted in HPI Dermatological ROS: negative for rash and skin  lesion changes  Physical Examination: Blood pressure 194/97, pulse 54, temperature 98 F (36.7 C), resp. rate 13, height $RemoveBe'5\' 3"'[id number]$  (1.6 m), weight 63.504 kg (140 lb), SpO2 99 %.  HEENT-  Normocephalic, no lesions, without obvious abnormality.  Normal external eye and conjunctiva.  Normal TM's bilaterally.  Normal auditory canals and external ears. Normal external nose, mucus membranes and septum.  Normal pharynx. Neck supple with no masses, nodes, nodules or enlargement. Cardiovascular - regular rate and rhythm, S1, S2 normal, no murmur, click, rub or gallop Lungs - chest clear, no wheezing, rales, normal symmetric air entry Abdomen - soft, non-tender; bowel sounds normal; no masses,  no organomegaly Extremities - no edema, no skin discoloration and no clubbing  Neurologic Examination: Mental Status: Alert, oriented, thought content appropriate.  Speech fluent without evidence of aphasia. Able to follow commands without difficulty. Cranial Nerves: II-Visual fields were normal. III/IV/VI-Pupils were equal and reacted normally to light. Extraocular movements were full and conjugate.    V/VII-no facial numbness and no facial weakness. VIII-normal. X-normal speech and symmetrical palatal movement. XI: trapezius strength/neck flexion strength normal bilaterally XII-midline tongue extension with normal strength. Motor: 5/5 bilaterally with normal tone and bulk Sensory: Reduced sensation to touch and pain involving the right lateral femoral cutaneous nerve. Sensory exam is otherwise normal. Deep Tendon Reflexes: 2+ knee jerk on the right and 1+ on the left; ankle reflexes were 2+ bilaterally. Plantars: Flexor bilaterally Cerebellar: Normal finger-to-nose testing. Carotid auscultation: Normal  Results for orders placed or performed during the hospital encounter of 04/12/15 (from the past 48 hour(s))  CBC with Differential     Status: Abnormal   Collection Time: 04/12/15  3:36 PM  Result Value  Ref Range   WBC 5.9 4.0 - 10.5 K/uL   RBC 4.81 3.87 - 5.11 MIL/uL   Hemoglobin 15.0 12.0 - 15.0 g/dL   HCT 44.0 36.0 - 46.0 %   MCV 91.5 78.0 - 100.0 fL   MCH 31.2 26.0 - 34.0 pg   MCHC 34.1 30.0 - 36.0 g/dL   RDW 11.2 (L) 11.5 - 15.5 %   Platelets 355 150 - 400 K/uL   Neutrophils Relative % 47 43 - 77 %   Neutro Abs 2.8 1.7 - 7.7 K/uL   Lymphocytes Relative 40 12 - 46 %   Lymphs Abs 2.4 0.7 - 4.0 K/uL   Monocytes Relative 9 3 - 12 %   Monocytes Absolute 0.6 0.1 - 1.0 K/uL   Eosinophils Relative 3 0 - 5 %   Eosinophils Absolute 0.2 0.0 - 0.7 K/uL   Basophils Relative 1 0 - 1 %   Basophils Absolute 0.1 0.0 - 0.1 K/uL  Comprehensive metabolic panel     Status: None   Collection Time: 04/12/15  3:36 PM  Result Value Ref Range   Sodium 138 135 - 145 mmol/L   Potassium 3.5 3.5 - 5.1 mmol/L   Chloride 105 96 - 112 mmol/L   CO2 24 19 - 32 mmol/L   Glucose, Bld 96 70 - 99 mg/dL   BUN 9 6 - 23 mg/dL   Creatinine, Ser 0.71 0.50 - 1.10 mg/dL   Calcium 9.2 8.4 - 10.5 mg/dL   Total Protein 7.7  6.0 - 8.3 g/dL   Albumin 3.7 3.5 - 5.2 g/dL   AST 13 0 - 37 U/L   ALT 10 0 - 35 U/L   Alkaline Phosphatase 62 39 - 117 U/L   Total Bilirubin 1.1 0.3 - 1.2 mg/dL   GFR calc non Af Amer >90 >90 mL/min   GFR calc Af Amer >90 >90 mL/min    Comment: (NOTE) The eGFR has been calculated using the CKD EPI equation. This calculation has not been validated in all clinical situations. eGFR's persistently <90 mL/min signify possible Chronic Kidney Disease.    Anion gap 9 5 - 15  Protime-INR     Status: None   Collection Time: 04/12/15  3:36 PM  Result Value Ref Range   Prothrombin Time 13.1 11.6 - 15.2 seconds   INR 0.98 0.00 - 1.49  APTT     Status: None   Collection Time: 04/12/15  3:36 PM  Result Value Ref Range   aPTT 27 24 - 37 seconds  I-stat troponin, ED (not at [facility], [facility])     Status: None   Collection Time: 04/12/15  3:48 PM  Result Value Ref Range   Troponin i, poc 0.00 0.00 - 0.08  ng/mL   Comment 3            Comment: Due to the release kinetics of cTnI, a negative result within the first hours of the onset of symptoms does not rule out myocardial infarction with certainty. If myocardial infarction is still suspected, repeat the test at appropriate intervals.   Urinalysis, Routine w reflex microscopic     Status: Abnormal   Collection Time: 04/12/15  6:25 PM  Result Value Ref Range   Color, Urine AMBER (A) YELLOW    Comment: BIOCHEMICALS MAY BE AFFECTED BY COLOR   APPearance CLEAR CLEAR   Specific Gravity, Urine 1.027 1.005 - 1.030   pH 5.0 5.0 - 8.0   Glucose, UA NEGATIVE NEGATIVE mg/dL   Hgb urine dipstick NEGATIVE NEGATIVE   Bilirubin Urine NEGATIVE NEGATIVE   Ketones, ur 15 (A) NEGATIVE mg/dL   Protein, ur NEGATIVE NEGATIVE mg/dL   Urobilinogen, UA 0.2 0.0 - 1.0 mg/dL   Nitrite NEGATIVE NEGATIVE   Leukocytes, UA NEGATIVE NEGATIVE    Comment: MICROSCOPIC NOT DONE ON URINES WITH NEGATIVE PROTEIN, BLOOD, LEUKOCYTES, NITRITE, OR GLUCOSE <1000 mg/dL.  Urine rapid drug screen (hosp performed)     Status: Abnormal   Collection Time: 04/12/15  6:25 PM  Result Value Ref Range   Opiates NONE DETECTED NONE DETECTED   Cocaine NONE DETECTED NONE DETECTED   Benzodiazepines NONE DETECTED NONE DETECTED   Amphetamines NONE DETECTED NONE DETECTED   Tetrahydrocannabinol POSITIVE (A) NONE DETECTED   Barbiturates NONE DETECTED NONE DETECTED    Comment:        DRUG SCREEN FOR MEDICAL PURPOSES ONLY.  IF CONFIRMATION IS NEEDED FOR ANY PURPOSE, NOTIFY LAB WITHIN 5 DAYS.        LOWEST DETECTABLE LIMITS FOR URINE DRUG SCREEN Drug Class       Cutoff (ng/mL) Amphetamine      1000 Barbiturate      200 Benzodiazepine   110 Tricyclics       315 Opiates          300 Cocaine          300 THC              50   Pregnancy, urine     Status: None  Collection Time: 04/12/15  6:25 PM  Result Value Ref Range   Preg Test, Ur NEGATIVE NEGATIVE    Comment:        THE  SENSITIVITY OF THIS METHODOLOGY IS >20 mIU/mL.    Ct Head Wo Contrast  04/12/2015   CLINICAL DATA:  Right leg numbness and pain for 1 day  EXAM: CT HEAD WITHOUT CONTRAST  TECHNIQUE: Contiguous axial images were obtained from the base of the skull through the vertex without intravenous contrast.  COMPARISON:  Head CT August 30, 2011 and brain MRI January 09, 2015  FINDINGS: The ventricles are normal in size and configuration. There is no intracranial mass, hemorrhage, extra-axial fluid collection, or midline shift. In comparison with the prior studies, there is a new focus of decreased attenuation in the rostrum of the corpus callosum on the left. There is slight decreased attenuation adjacent to the frontal horns of the lateral ventricles. Elsewhere gray-white compartments appear normal. The bony calvarium appears intact. The mastoid air cells are clear.  IMPRESSION: Focal decreased attenuation in the rostrum of the left corpus callosum. This finding potentially could represent a focal infarct in this area, age uncertain. This finding also, however, could represent demyelination. Multiple sclerosis must be of concern given this appearance as well as the white-matter lesions seen on recent prior MR. no other new gray-white compartment lesion is identified. No hemorrhage or mass effect.   Electronically Signed   By: Lowella Grip III M.D.   On: 04/12/2015 18:48   Mr Brain Wo Contrast (neuro Protocol)  04/12/2015   CLINICAL DATA:  Acute onset of right leg tingling and numbness beginning at 1 a.m. last night. Personal history of CVA.  EXAM: MRI HEAD WITHOUT CONTRAST  TECHNIQUE: Multiplanar, multiecho pulse sequences of the brain and surrounding structures were obtained without intravenous contrast.  COMPARISON:  CT head without contrast 04/12/2015. MRI brain 01/09/2015.  FINDINGS: The diffusion-weighted images demonstrate no evidence for acute or subacute infarction. The a remote white matter infarct is  present in the posterior left coronal radiata. A 10 mm T2 hyperintensity along the anterior genu of the corpus callosum on the left is new since the prior study. Periventricular white matter changes are otherwise stable.  The ventricles are of normal size. No significant extraaxial fluid collection is present.  Flow is present in the major intracranial arteries. The globes orbits are intact. The paranasal sinuses and mastoid air cells are clear. The skullbase is normal. Midline structures demonstrate prominent adenoid tissue. Decreased T1 marrow signal is noted in the upper cervical spine.  IMPRESSION: 1. New T2 hyperintensity along the anterior genu of the corpus callosum on the left. This could be related to in interval infarct. There is no acute infarct or restricted diffusion. Alternatively, this could be a demyelinating lesion. 2. Periventricular white matter changes bilaterally. The primary differential diagnosis is premature ischemic changes versus a demyelinating process.   Electronically Signed   By: San Morelle M.D.   On: 04/12/2015 21:22    Assessment/Plan 44 year old lady presenting with new onset of pain and tingling as well as numbness involving right lateral thigh, consistent with lateral femoral cutaneous neuropathy (meralgia paresthetica). She has no clinical signs of an acute stroke. Significance of findings involving corpus callosum on CT scan and MRI is unclear particularly with no associated clinical abnormality.  Recommendations: 1. Increase Neurontin from 100 mg 3 times a day to 200 mg 3 times a day. 2. Follow-up with primary care physician and recommend referral  to outpatient neurology symptoms persist. 3. Repeat MRI study in 6-12 months for comparison with current study.  C.R. Nicole Kindred, [place] [facility] [phone]  04/12/2015, 9:29 PM

## 2015-04-12 NOTE — ED Notes (Signed)
Neurology at bedside.

## 2015-04-12 NOTE — ED Notes (Signed)
Pt in MRI at this time 

## 2015-04-12 NOTE — ED Notes (Signed)
Pt ambulatory to bathroom without any problems 

## 2015-04-19 ENCOUNTER — Ambulatory Visit: Payer: 59 | Admitting: Internal Medicine

## 2015-04-25 DIAGNOSIS — Z029 Encounter for administrative examinations, unspecified: Secondary | ICD-10-CM

## 2015-05-25 DIAGNOSIS — F419 Anxiety disorder, unspecified: Secondary | ICD-10-CM | POA: Insufficient documentation

## 2015-05-25 DIAGNOSIS — Z8673 Personal history of transient ischemic attack (TIA), and cerebral infarction without residual deficits: Secondary | ICD-10-CM | POA: Insufficient documentation

## 2015-05-25 DIAGNOSIS — K589 Irritable bowel syndrome without diarrhea: Secondary | ICD-10-CM | POA: Insufficient documentation

## 2015-05-25 DIAGNOSIS — M8580 Other specified disorders of bone density and structure, unspecified site: Secondary | ICD-10-CM | POA: Insufficient documentation

## 2015-06-22 ENCOUNTER — Other Ambulatory Visit: Payer: Self-pay | Admitting: Internal Medicine

## 2015-09-15 ENCOUNTER — Other Ambulatory Visit: Payer: Self-pay | Admitting: Internal Medicine

## 2015-09-15 ENCOUNTER — Other Ambulatory Visit: Payer: Self-pay

## 2015-09-15 MED ORDER — ZOLPIDEM TARTRATE 5 MG PO TABS: 5.0000 mg | ORAL_TABLET | Freq: Every evening | ORAL | Status: DC | PRN

## 2016-09-17 DIAGNOSIS — F319 Bipolar disorder, unspecified: Secondary | ICD-10-CM | POA: Insufficient documentation

## 2016-11-07 ENCOUNTER — Other Ambulatory Visit: Payer: Self-pay | Admitting: *Deleted

## 2016-11-07 DIAGNOSIS — Z1231 Encounter for screening mammogram for malignant neoplasm of breast: Secondary | ICD-10-CM

## 2018-06-17 ENCOUNTER — Other Ambulatory Visit: Payer: Self-pay | Admitting: Physician Assistant

## 2018-06-17 DIAGNOSIS — Z1231 Encounter for screening mammogram for malignant neoplasm of breast: Secondary | ICD-10-CM

## 2018-08-07 DIAGNOSIS — Z87891 Personal history of nicotine dependence: Secondary | ICD-10-CM | POA: Insufficient documentation

## 2018-08-19 ENCOUNTER — Encounter: Payer: Self-pay | Admitting: Internal Medicine

## 2018-09-18 ENCOUNTER — Ambulatory Visit: Payer: Self-pay

## 2019-01-02 ENCOUNTER — Emergency Department (HOSPITAL_COMMUNITY): Payer: Medicare Other

## 2019-01-02 ENCOUNTER — Emergency Department (HOSPITAL_COMMUNITY)
Admission: EM | Admit: 2019-01-02 | Discharge: 2019-01-02 | Disposition: A | Discharge status: Home / Self Care | Payer: Medicare Other | Source: Home / Self Care | Attending: Emergency Medicine | Admitting: Emergency Medicine

## 2019-01-02 ENCOUNTER — Encounter (HOSPITAL_COMMUNITY): Payer: Self-pay | Admitting: Nurse Practitioner

## 2019-01-02 DIAGNOSIS — A020 Salmonella enteritis: Secondary | ICD-10-CM | POA: Diagnosis not present

## 2019-01-02 DIAGNOSIS — Z79899 Other long term (current) drug therapy: Secondary | ICD-10-CM

## 2019-01-02 DIAGNOSIS — I5032 Chronic diastolic (congestive) heart failure: Secondary | ICD-10-CM | POA: Insufficient documentation

## 2019-01-02 DIAGNOSIS — R1030 Lower abdominal pain, unspecified: Secondary | ICD-10-CM | POA: Diagnosis not present

## 2019-01-02 DIAGNOSIS — J449 Chronic obstructive pulmonary disease, unspecified: Secondary | ICD-10-CM | POA: Insufficient documentation

## 2019-01-02 DIAGNOSIS — Z7982 Long term (current) use of aspirin: Secondary | ICD-10-CM | POA: Insufficient documentation

## 2019-01-02 DIAGNOSIS — K529 Noninfective gastroenteritis and colitis, unspecified: Secondary | ICD-10-CM

## 2019-01-02 DIAGNOSIS — Z87891 Personal history of nicotine dependence: Secondary | ICD-10-CM | POA: Insufficient documentation

## 2019-01-02 DIAGNOSIS — I110 Hypertensive heart disease with heart failure: Secondary | ICD-10-CM | POA: Insufficient documentation

## 2019-01-02 LAB — COMPREHENSIVE METABOLIC PANEL
ALT: 10 U/L (ref 0–44)
AST: 16 U/L (ref 15–41)
Albumin: 4.1 g/dL (ref 3.5–5.0)
Alkaline Phosphatase: 58 U/L (ref 38–126)
Anion gap: 9 (ref 5–15)
BUN: 9 mg/dL (ref 6–20)
CALCIUM: 8.9 mg/dL (ref 8.9–10.3)
CO2: 23 mmol/L (ref 22–32)
Chloride: 107 mmol/L (ref 98–111)
Creatinine, Ser: 0.77 mg/dL (ref 0.44–1.00)
GFR calc Af Amer: >60 mL/min (ref >60)
GFR calc non Af Amer: >60 mL/min (ref >60)
Glucose, Bld: 103 mg/dL — ABNORMAL HIGH (ref 70–99)
Potassium: 3.9 mmol/L (ref 3.5–5.1)
Sodium: 139 mmol/L (ref 135–145)
TOTAL PROTEIN: 7.9 g/dL (ref 6.5–8.1)
Total Bilirubin: 0.7 mg/dL (ref 0.3–1.2)

## 2019-01-02 LAB — URINALYSIS, ROUTINE W REFLEX MICROSCOPIC
BILIRUBIN URINE: NEGATIVE
Glucose, UA: NEGATIVE mg/dL
Ketones, ur: NEGATIVE mg/dL
Leukocytes, UA: NEGATIVE
Nitrite: NEGATIVE
PH: 5 (ref 5.0–8.0)
Protein, ur: NEGATIVE mg/dL
SPECIFIC GRAVITY, URINE: 1.025 (ref 1.005–1.030)

## 2019-01-02 LAB — LIPASE, BLOOD: Lipase: 38 U/L (ref 11–51)

## 2019-01-02 LAB — CBC
HCT: 45.8 % (ref 36.0–46.0)
Hemoglobin: 14.9 g/dL (ref 12.0–15.0)
MCH: 31.4 pg (ref 26.0–34.0)
MCHC: 32.5 g/dL (ref 30.0–36.0)
MCV: 96.4 fL (ref 80.0–100.0)
Platelets: 282 10*3/uL (ref 150–400)
RBC: 4.75 MIL/uL (ref 3.87–5.11)
RDW: 11.2 % — AB (ref 11.5–15.5)
WBC: 8.4 10*3/uL (ref 4.0–10.5)
nRBC: 0 % (ref 0.0–0.2)

## 2019-01-02 LAB — TYPE AND SCREEN
ABO/RH(D): A POS
ANTIBODY SCREEN: NEGATIVE

## 2019-01-02 LAB — I-STAT BETA HCG BLOOD, ED (MC, WL, AP ONLY): I-stat hCG, quantitative: <5 m[IU]/mL (ref <5)

## 2019-01-02 MED ORDER — SODIUM CHLORIDE (PF) 0.9 % IJ SOLN
INTRAMUSCULAR | Status: AC
  Filled 2019-01-02: qty 50

## 2019-01-02 MED ORDER — IOPAMIDOL (ISOVUE-300) INJECTION 61%
INTRAVENOUS | Status: AC
  Filled 2019-01-02: qty 100

## 2019-01-02 MED ORDER — ONDANSETRON HCL 4 MG/2ML IJ SOLN
4.0000 mg | Freq: Once | INTRAMUSCULAR | Status: AC
  Administered 2019-01-02: 4 mg via INTRAVENOUS
  Filled 2019-01-02: qty 2

## 2019-01-02 MED ORDER — SODIUM CHLORIDE 0.9 % IV BOLUS
1000.0000 mL | Freq: Once | INTRAVENOUS | Status: AC
  Administered 2019-01-02: 1000 mL via INTRAVENOUS

## 2019-01-02 MED ORDER — ONDANSETRON 4 MG PO TBDP: 4.0000 mg | ORAL_TABLET | Freq: Three times a day (TID) | ORAL | 0 refills | Status: DC | PRN

## 2019-01-02 MED ORDER — IOPAMIDOL (ISOVUE-300) INJECTION 61%
100.0000 mL | Freq: Once | INTRAVENOUS | Status: AC | PRN
  Administered 2019-01-02: 100 mL via INTRAVENOUS

## 2019-01-02 MED ORDER — MORPHINE SULFATE (PF) 4 MG/ML IV SOLN
4.0000 mg | Freq: Once | INTRAVENOUS | Status: AC
  Administered 2019-01-02: 4 mg via INTRAVENOUS
  Filled 2019-01-02: qty 1

## 2019-01-02 MED ORDER — DICYCLOMINE HCL 20 MG PO TABS: 20.0000 mg | ORAL_TABLET | Freq: Two times a day (BID) | ORAL | 0 refills | Status: AC

## 2019-01-02 NOTE — ED Notes (Signed)
Pt unable to offer stool sample for the ordered PCR lab test.

## 2019-01-02 NOTE — ED Notes (Signed)
Urine culture sent down to lab with urinalysis. 

## 2019-01-02 NOTE — ED Notes (Signed)
Patient given ginger ale for the fluid challenge.

## 2019-01-02 NOTE — ED Provider Notes (Signed)
[facility]-EMERGENCY DEPT Provider Note   CSN: [id number] Arrival date & time: 01/02/19  1513     History   Chief Complaint Chief Complaint  Patient presents with  . Abdominal Pain    HPI [name] is a 48 y.o. female with history of bipolar disorder, CHF, osteoporosis, obesity, sarcoidosis, HLD, CVA, CHF presents for evaluation of acute onset, progressively worsening abdominal pain since yesterday.  She notes intermittent severe crampy lower abdominal pain since yesterday which has been worsening.  Pain will last for several minutes and then improve and then return.  She has had multiple episodes of nonbloody nonbilious emesis.  Just prior to my assessment she had a bowel movement with bright red blood.  Denies diarrhea or constipation.  Notes subjective fevers and chills.  Denies chest pain or shortness of breath.  Has taken 2 Vicodin at home without relief of her symptoms.  She did eat at a fast food restaurant prior to symptom onset.  Has not been able to keep her home medicines down including her blood pressure medicine.  The history is provided by the patient.    Past Medical History:  Diagnosis Date  . Allergic rhinitis due to pollen   . Allergic rhinitis due to pollen   . Bipolar disorder (HCC)   . CHF (congestive heart failure) (HCC)   . Cocaine dependence (HCC) 2012  . Depressive disorder, not elsewhere classified   . Hypertension   . Obesity, unspecified   . Osteoporosis, unspecified   . Other abnormal blood chemistry   . Other and unspecified hyperlipidemia   . Other and unspecified hyperlipidemia   . Other convulsions   . Other malaise and fatigue   . Reflux esophagitis   . Sarcoidosis   . Seizure disorder (HCC)   . Tobacco use disorder    stopped smoking  . Unspecified constipation   . Unspecified hereditary and idiopathic peripheral neuropathy   . Unspecified late effects of cerebrovascular disease   . Unspecified sleep apnea   .  Unspecified vitamin D deficiency     Patient Active Problem List   Diagnosis Date Noted  . Meralgia paresthetica of right side 04/12/2015  . Hyperlipidemia LDL goal <100 12/20/2014  . Chronic diastolic congestive heart failure (HCC) 04/27/2014  . s 04/27/2014  . 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 Encounter for therapeutic drug monitoring 04/27/2014  . COPD (chronic obstructive pulmonary disease) (HCC) 04/05/2014  . CHF (congestive heart failure) (HCC) 04/05/2014  . Muscle weakness of right arm 04/05/2014  . Tinea versicolor 09/08/2013  . Hyperlipidemia with target LDL less than 130 06/08/2013  . Peripheral neuropathy 06/08/2013  . Essential hypertension, benign 06/08/2013  . Late effects of CVA (cerebrovascular accident) 06/08/2013  . GERD (gastroesophageal reflux disease) 06/08/2013  . Constipation 06/08/2013  . Chronic low back pain 06/08/2013  . Insomnia 06/08/2013    Past Surgical History:  Procedure Laterality Date  . CHOLECYSTECTOMY    . FRACTURE SURGERY     right arm  . LUNG BIOPSY       OB History   No obstetric history on file.      Home Medications    Prior to Admission medications   Medication Sig Start Date End Date Taking? Authorizing Provider  albuterol (PROVENTIL HFA;VENTOLIN HFA) 108 (90 BASE) MCG/ACT inhaler Inhale 2 puffs into the lungs every 6 (six) hours as needed for wheezing or shortness of breath. 08/09/14  Yes Oneal GroutPandey, Mahima, MD  aspirin 81 MG tablet  Take 1 tablet (81 mg total) by mouth daily. 04/05/14  Yes Oneal GroutPandey, Mahima, MD  lisinopril (PRINIVIL,ZESTRIL) 20 MG tablet Take one tablet by mouth once daily for blood pressure 08/09/14  Yes Oneal GroutPandey, Mahima, MD  amLODipine (NORVASC) 5 MG tablet Take one tablet once daily for blood pressure and heart Patient not taking: Reported on 04/12/2015 01/18/15   Kirt Boysarter, Monica, DO  citalopram (CELEXA) 20 MG tablet Take 1 tablet (20 mg total) by mouth daily. Patient not taking: Reported on 04/12/2015 12/20/14    Oneal GroutPandey, Mahima, MD  clopidogrel (PLAVIX) 75 MG tablet Take one tablet once daily Patient not taking: Reported on 04/12/2015 04/05/14   Oneal GroutPandey, Mahima, MD  dexlansoprazole (DEXILANT) 60 MG capsule TAKE ONE CAPSULE BY MOUTH EVERY DAY Patient not taking: Reported on 04/12/2015 01/18/15   Kirt Boysarter, Monica, DO  gabapentin (NEURONTIN) 100 MG capsule Take 1 capsule (100 mg total) by mouth 3 (three) times daily. Take 100 mg TID for first 1 week, if not improved increase to 200 mg TID Patient not taking: Reported on 01/02/2019 04/12/15   Pricilla LovelessGoldston, Scott, MD  lubiprostone (AMITIZA) 8 MCG capsule TAKE ONE CAPSULE BY MOUTH TWICE DAILY FOR  CONSTIPATION Patient not taking: Reported on 04/12/2015 04/27/14   Oneal GroutPandey, Mahima, MD  metoprolol succinate (TOPROL-XL) 25 MG 24 hr tablet Take 0.5 tablets (12.5 mg total) by mouth daily. Patient not taking: Reported on 04/12/2015 01/18/15   Kirt Boysarter, Monica, DO  ondansetron (ZOFRAN ODT) 4 MG disintegrating tablet Take 1 tablet (4 mg total) by mouth every 8 (eight) hours as needed for nausea or vomiting. 01/02/19   Lucia Harm A, PA-C  pravastatin (PRAVACHOL) 10 MG tablet Take 1 tablet (10 mg total) by mouth daily. Patient not taking: Reported on 04/12/2015 08/09/14   Oneal GroutPandey, Mahima, MD  ranitidine (ZANTAC) 150 MG tablet Take 1 tablet (150 mg total) by mouth 2 (two) times daily. Patient not taking: Reported on 04/12/2015 12/20/14   Oneal GroutPandey, Mahima, MD  traMADol (ULTRAM) 50 MG tablet TAKE ONE TABLET BY MOUTH EVERY 6 HOURS AS NEEDED FOR PAIN AS DIRECTED (ONE  TABLET  FOR  PAIN  1-5/10  AND  2  TABLETS  FOR  PAIN  6-10/10) Patient not taking: Reported on 04/12/2015 03/03/15   Kirt Boysarter, Monica, DO  zolpidem (AMBIEN) 5 MG tablet Take 1 tablet (5 mg total) by mouth at bedtime as needed. for sleep Patient not taking: Reported on 01/02/2019 09/15/15   Sharon SellerEubanks, Jessica K, NP    Family History Family History  Problem Relation Age of Onset  . Diabetes Mother   . Hypertension Mother   . Hypertension Father    . Stroke Father   . Cancer Paternal Uncle        breast    Social History Social History   Tobacco Use  . Smoking status: Former Smoker    Last attempt to quit: 05/30/2013    Years since quitting: 5.5  Substance Use Topics  . Alcohol use: No  . Drug use: No     Allergies   Quetiapine fumarate; Risperdal [risperidone]; Naproxen; Risperidone and related; and Seroquel [quetiapine fumarate]   Review of Systems Review of Systems  Constitutional: Positive for chills and fever.  Respiratory: Negative for shortness of breath.   Cardiovascular: Negative for chest pain.  Gastrointestinal: Positive for abdominal pain, blood in stool, nausea and vomiting.  Genitourinary: Negative for dysuria, frequency, hematuria, urgency, vaginal bleeding and vaginal pain.  All other systems reviewed and are negative.    Physical Exam  Updated Vital Signs BP (!) 179/96   Pulse 60   Temp 98.7 F (37.1 C) (Oral)   Resp 18   SpO2 100%   Physical Exam Vitals signs and nursing note reviewed.  Constitutional:      General: She is not in acute distress.    Appearance: She is well-developed.  HENT:     Head: Normocephalic and atraumatic.  Eyes:     General:        Right eye: No discharge.        Left eye: No discharge.     Conjunctiva/sclera: Conjunctivae normal.  Neck:     Vascular: No JVD.     Trachea: No tracheal deviation.  Cardiovascular:     Rate and Rhythm: Normal rate.     Heart sounds: Normal heart sounds.  Pulmonary:     Effort: Pulmonary effort is normal.     Breath sounds: Normal breath sounds.  Abdominal:     General: Bowel sounds are normal. There is no distension.     Palpations: Abdomen is soft.     Tenderness: There is abdominal tenderness in the suprapubic area and left lower quadrant. There is no right CVA tenderness, left CVA tenderness, guarding or rebound. Negative signs include Murphy's sign, Rovsing's sign, McBurney's sign and psoas sign.  Skin:    General: Skin  is warm and dry.     Findings: No erythema.  Neurological:     Mental Status: She is alert.  Psychiatric:        Behavior: Behavior normal.      ED Treatments / Results  Labs (all labs ordered are listed, but only abnormal results are displayed) Labs Reviewed  COMPREHENSIVE METABOLIC PANEL - Abnormal; Notable for the following components:      Result Value   Glucose, Bld 103 (*)    All other components within normal limits  CBC - Abnormal; Notable for the following components:   RDW 11.2 (*)    All other components within normal limits  URINALYSIS, ROUTINE W REFLEX MICROSCOPIC - Abnormal; Notable for the following components:   Hgb urine dipstick MODERATE (*)    Bacteria, UA RARE (*)    All other components within normal limits  GASTROINTESTINAL PANEL BY PCR, STOOL (REPLACES STOOL CULTURE)  C DIFFICILE QUICK SCREEN W PCR REFLEX  LIPASE, BLOOD  I-STAT BETA HCG BLOOD, ED ([facility], [facility], [facility] ONLY)  POC OCCULT BLOOD, ED  TYPE AND SCREEN  ABO/RH    EKG None  Radiology Ct Abdomen Pelvis W Contrast  Result Date: 01/02/2019 CLINICAL DATA:  Severe abdominal pain, nausea, vomiting and diarrhea. History of cholecystectomy, sarcoidosis and constipation. EXAM: CT ABDOMEN AND PELVIS WITH CONTRAST TECHNIQUE: Multidetector CT imaging of the abdomen and pelvis was performed using the standard protocol following bolus administration of intravenous contrast. CONTRAST:  ISOVUE-300 IOPAMIDOL (ISOVUE-300) INJECTION 61% COMPARISON:  None. FINDINGS: LOWER CHEST: Bibasilar scarring and linear calcifications versus surgical suture material in lingula. No pleural effusion or focal consolidation. Included heart size is normal. HEPATOBILIARY: Status post cholecystectomy.  Normal liver. PANCREAS: Normal. SPLEEN: Normal. ADRENALS/URINARY TRACT: Kidneys are orthotopic, demonstrating symmetric enhancement. No nephrolithiasis, hydronephrosis or solid renal masses. Too small to characterize RIGHT interpolar  hypodensity. The unopacified ureters are normal in course and caliber. Delayed imaging through the kidneys demonstrates symmetric prompt contrast excretion within the proximal urinary collecting system. Urinary bladder is partially distended and unremarkable. Normal adrenal glands. STOMACH/BOWEL: Colonic wall thickening and edema from cecum through descending colon with  pericolonic fat stranding. Mild descending and sigmoid colonic diverticulosis. Small amount of small bowel feces compatible with chronic stasis. The stomach, small bowel are normal in course and caliber without inflammatory changes. Mild proximal appendix mural enhancement, likely secondary to adjacent colitis as remainder of appendix is normal. VASCULAR/LYMPHATIC: Aortoiliac vessels are normal in course and caliber. No lymphadenopathy by CT size criteria. REPRODUCTIVE: Normal. OTHER: Minimal free fluid in the pelvis. No focal fluid collection or intraperitoneal free air. MUSCULOSKELETAL: Nonacute.  Small L4-5 and L5-S1 disc bulges. IMPRESSION: 1. Nonspecific colitis without complication. Electronically Signed   By: Awilda Metro M.D.   On: 01/02/2019 18:34    Procedures Procedures (including critical care time)  Medications Ordered in ED Medications  iopamidol (ISOVUE-300) 61 % injection (has no administration in time range)  sodium chloride (PF) 0.9 % injection (has no administration in time range)  morphine 4 MG/ML injection 4 mg (4 mg Intravenous Given 01/02/19 1737)  ondansetron (ZOFRAN) injection 4 mg (4 mg Intravenous Given 01/02/19 1736)  sodium chloride 0.9 % bolus 1,000 mL (0 mLs Intravenous Stopped 01/02/19 1846)  iopamidol (ISOVUE-300) 61 % injection 100 mL (100 mLs Intravenous Contrast Given 01/02/19 1806)  morphine 4 MG/ML injection 4 mg (4 mg Intravenous Given 01/02/19 1951)     Initial Impression / Assessment and Plan / ED Course  I have reviewed the triage vital signs and the nursing notes.  Pertinent labs & imaging  results that were available during my care of the patient were reviewed by me and considered in my medical decision making (see chart for details).     Patient presenting for evaluation of abdominal pain, nausea, vomiting, and diarrhea.  She is afebrile, hypertensive in the ED with improvement on reevaluation.  She has not had her antihypertensive medicines today due to her vomiting.  Lab work reviewed by me shows no leukocytosis, no anemia, no metabolic derangements.  LFTs, lipase, creatinine within normal limits.  UA does not suggest UTI or nephrolithiasis.  CT of the abdomen and pelvis shows evidence of colitis.  No acute surgical abdominal pathology noted including obstruction, perforation, appendicitis, cholecystitis.  Doubt GU pathology including ectopic pregnancy, ovarian torsion, TOA, or PID in the absence of genitourinary symptoms.  On reevaluation patient is resting comfortably no apparent distress.  She has had improvement in her pain with pain medicine, fluids, antiemetics.  She is tolerating p.o. fluids without difficulty.  Serial abdominal examinations remained benign.  Will discharge with Zofran as needed for nausea, instructions to advance diet slowly.  Recommend follow-up with PCP if symptoms persist.  Discussed strict ED return precautions. Pt verbalized understanding of and agreement with plan and is safe for discharge home at this time.   Final Clinical Impressions(s) / ED Diagnoses   Final diagnoses:  Colitis    ED Discharge Orders         Ordered    ondansetron (ZOFRAN ODT) 4 MG disintegrating tablet  Every 8 hours PRN     01/02/19 2042           Bennye Alm 01/02/19 2044    Linwood Dibbles, MD 01/03/19 2152

## 2019-01-02 NOTE — ED Triage Notes (Signed)
Pt is c/o severe abdominal pain and associated N/V/D. Denies any GI hx, reports hx of CVA and of concern during this triage is her elevated BP.

## 2019-01-02 NOTE — ED Notes (Signed)
Pt tolerated 8 oz of ginger ale for fluid challenge.

## 2019-01-02 NOTE — Discharge Instructions (Signed)
1. Medications: Take Bentyl as needed for bowel cramps/spasms.  Take Zofran as needed for nausea.  Let this medicine dissolve under your tongue.  Wait around 20 minutes before eating or drinking after taking this medication. 2. Treatment: rest, drink plenty of fluids, advance diet slowly.  Start with water and broth then advance to bland foods that will not upset your stomach such as crackers, mashed potatoes, and peanut butter. 3. Follow Up: Please followup with your primary doctor in 3 days for discussion of your diagnoses and further evaluation after today's visit; if you do not have a primary care doctor use the resource guide provided to find one; Please return to the ER for persistent vomiting, high fevers or worsening symptoms

## 2019-01-03 ENCOUNTER — Other Ambulatory Visit: Payer: Self-pay

## 2019-01-03 ENCOUNTER — Inpatient Hospital Stay (HOSPITAL_COMMUNITY)
Admission: EM | Admit: 2019-01-03 | Discharge: 2019-01-07 | DRG: 372 | Disposition: A | Discharge status: Home / Self Care | Payer: Medicare Other | Attending: Internal Medicine | Admitting: Internal Medicine

## 2019-01-03 ENCOUNTER — Encounter (HOSPITAL_COMMUNITY): Payer: Self-pay

## 2019-01-03 DIAGNOSIS — R112 Nausea with vomiting, unspecified: Secondary | ICD-10-CM

## 2019-01-03 DIAGNOSIS — I6930 Unspecified sequelae of cerebral infarction: Secondary | ICD-10-CM

## 2019-01-03 DIAGNOSIS — I6990 Unspecified sequelae of unspecified cerebrovascular disease: Secondary | ICD-10-CM

## 2019-01-03 DIAGNOSIS — I110 Hypertensive heart disease with heart failure: Secondary | ICD-10-CM | POA: Diagnosis present

## 2019-01-03 DIAGNOSIS — E861 Hypovolemia: Secondary | ICD-10-CM | POA: Diagnosis present

## 2019-01-03 DIAGNOSIS — F319 Bipolar disorder, unspecified: Secondary | ICD-10-CM | POA: Diagnosis present

## 2019-01-03 DIAGNOSIS — G609 Hereditary and idiopathic neuropathy, unspecified: Secondary | ICD-10-CM | POA: Diagnosis present

## 2019-01-03 DIAGNOSIS — Z823 Family history of stroke: Secondary | ICD-10-CM

## 2019-01-03 DIAGNOSIS — G40909 Epilepsy, unspecified, not intractable, without status epilepticus: Secondary | ICD-10-CM | POA: Diagnosis present

## 2019-01-03 DIAGNOSIS — Z7982 Long term (current) use of aspirin: Secondary | ICD-10-CM

## 2019-01-03 DIAGNOSIS — Z886 Allergy status to analgesic agent status: Secondary | ICD-10-CM

## 2019-01-03 DIAGNOSIS — K529 Noninfective gastroenteritis and colitis, unspecified: Secondary | ICD-10-CM | POA: Diagnosis present

## 2019-01-03 DIAGNOSIS — G4730 Sleep apnea, unspecified: Secondary | ICD-10-CM | POA: Diagnosis present

## 2019-01-03 DIAGNOSIS — Z79899 Other long term (current) drug therapy: Secondary | ICD-10-CM

## 2019-01-03 DIAGNOSIS — D860 Sarcoidosis of lung: Secondary | ICD-10-CM | POA: Diagnosis present

## 2019-01-03 DIAGNOSIS — M810 Age-related osteoporosis without current pathological fracture: Secondary | ICD-10-CM | POA: Diagnosis present

## 2019-01-03 DIAGNOSIS — Z888 Allergy status to other drugs, medicaments and biological substances status: Secondary | ICD-10-CM

## 2019-01-03 DIAGNOSIS — Z87891 Personal history of nicotine dependence: Secondary | ICD-10-CM

## 2019-01-03 DIAGNOSIS — Z8249 Family history of ischemic heart disease and other diseases of the circulatory system: Secondary | ICD-10-CM

## 2019-01-03 DIAGNOSIS — E785 Hyperlipidemia, unspecified: Secondary | ICD-10-CM | POA: Diagnosis present

## 2019-01-03 DIAGNOSIS — Z23 Encounter for immunization: Secondary | ICD-10-CM

## 2019-01-03 DIAGNOSIS — I5032 Chronic diastolic (congestive) heart failure: Secondary | ICD-10-CM | POA: Diagnosis present

## 2019-01-03 DIAGNOSIS — Z8673 Personal history of transient ischemic attack (TIA), and cerebral infarction without residual deficits: Secondary | ICD-10-CM

## 2019-01-03 DIAGNOSIS — A020 Salmonella enteritis: Principal | ICD-10-CM | POA: Diagnosis present

## 2019-01-03 DIAGNOSIS — I160 Hypertensive urgency: Secondary | ICD-10-CM | POA: Diagnosis present

## 2019-01-03 DIAGNOSIS — E876 Hypokalemia: Secondary | ICD-10-CM | POA: Diagnosis present

## 2019-01-03 DIAGNOSIS — Z9049 Acquired absence of other specified parts of digestive tract: Secondary | ICD-10-CM

## 2019-01-03 LAB — LIPASE, BLOOD: Lipase: 32 U/L (ref 11–51)

## 2019-01-03 LAB — COMPREHENSIVE METABOLIC PANEL
ALT: 18 U/L (ref 0–44)
AST: 27 U/L (ref 15–41)
Albumin: 3.7 g/dL (ref 3.5–5.0)
Alkaline Phosphatase: 51 U/L (ref 38–126)
Anion gap: 7 (ref 5–15)
BUN: 7 mg/dL (ref 6–20)
CO2: 24 mmol/L (ref 22–32)
Calcium: 8.7 mg/dL — ABNORMAL LOW (ref 8.9–10.3)
Chloride: 107 mmol/L (ref 98–111)
Creatinine, Ser: 0.78 mg/dL (ref 0.44–1.00)
GFR calc Af Amer: >60 mL/min (ref >60)
GFR calc non Af Amer: >60 mL/min (ref >60)
Glucose, Bld: 94 mg/dL (ref 70–99)
Potassium: 3.8 mmol/L (ref 3.5–5.1)
Sodium: 138 mmol/L (ref 135–145)
Total Bilirubin: 0.8 mg/dL (ref 0.3–1.2)
Total Protein: 7.2 g/dL (ref 6.5–8.1)

## 2019-01-03 LAB — CBC
HCT: 43.3 % (ref 36.0–46.0)
Hemoglobin: 14 g/dL (ref 12.0–15.0)
MCH: 31.3 pg (ref 26.0–34.0)
MCHC: 32.3 g/dL (ref 30.0–36.0)
MCV: 96.7 fL (ref 80.0–100.0)
Platelets: 248 10*3/uL (ref 150–400)
RBC: 4.48 MIL/uL (ref 3.87–5.11)
RDW: 11.3 % — ABNORMAL LOW (ref 11.5–15.5)
WBC: 7.8 10*3/uL (ref 4.0–10.5)
nRBC: 0 % (ref 0.0–0.2)

## 2019-01-03 LAB — I-STAT BETA HCG BLOOD, ED (MC, WL, AP ONLY): I-stat hCG, quantitative: <5 m[IU]/mL (ref <5)

## 2019-01-03 LAB — ABO/RH: ABO/RH(D): A POS

## 2019-01-03 MED ORDER — FENTANYL CITRATE (PF) 100 MCG/2ML IJ SOLN
50.0000 ug | Freq: Once | INTRAMUSCULAR | Status: AC
  Administered 2019-01-04: 50 ug via INTRAVENOUS
  Filled 2019-01-03: qty 2

## 2019-01-03 MED ORDER — METRONIDAZOLE IN NACL 5-0.79 MG/ML-% IV SOLN
500.0000 mg | Freq: Once | INTRAVENOUS | Status: AC
  Administered 2019-01-04: 500 mg via INTRAVENOUS
  Filled 2019-01-03: qty 100

## 2019-01-03 MED ORDER — SODIUM CHLORIDE 0.9 % IV BOLUS
1000.0000 mL | Freq: Once | INTRAVENOUS | Status: AC
  Administered 2019-01-04: 1000 mL via INTRAVENOUS

## 2019-01-03 MED ORDER — SODIUM CHLORIDE 0.9 % IV SOLN
2.0000 g | Freq: Once | INTRAVENOUS | Status: AC
  Administered 2019-01-04: 2 g via INTRAVENOUS
  Filled 2019-01-03: qty 20

## 2019-01-03 MED ORDER — SODIUM CHLORIDE 0.9 % IV BOLUS: 1000.0000 mL | Freq: Once | INTRAVENOUS | Status: DC

## 2019-01-03 MED ORDER — ONDANSETRON HCL 4 MG/2ML IJ SOLN
4.0000 mg | Freq: Once | INTRAMUSCULAR | Status: AC
  Administered 2019-01-04: 4 mg via INTRAVENOUS
  Filled 2019-01-03: qty 2

## 2019-01-03 NOTE — ED Triage Notes (Signed)
States seen here yesterday and diagnosed with colitis and has taken antibiotics today states severe pain voiced. No fever noted.

## 2019-01-04 ENCOUNTER — Encounter (HOSPITAL_COMMUNITY): Payer: Self-pay | Admitting: Family Medicine

## 2019-01-04 ENCOUNTER — Other Ambulatory Visit: Payer: Self-pay

## 2019-01-04 DIAGNOSIS — K529 Noninfective gastroenteritis and colitis, unspecified: Secondary | ICD-10-CM

## 2019-01-04 DIAGNOSIS — I160 Hypertensive urgency: Secondary | ICD-10-CM | POA: Diagnosis not present

## 2019-01-04 DIAGNOSIS — I5032 Chronic diastolic (congestive) heart failure: Secondary | ICD-10-CM

## 2019-01-04 DIAGNOSIS — D860 Sarcoidosis of lung: Secondary | ICD-10-CM

## 2019-01-04 DIAGNOSIS — R112 Nausea with vomiting, unspecified: Secondary | ICD-10-CM | POA: Diagnosis not present

## 2019-01-04 DIAGNOSIS — Z8673 Personal history of transient ischemic attack (TIA), and cerebral infarction without residual deficits: Secondary | ICD-10-CM | POA: Insufficient documentation

## 2019-01-04 LAB — CBC WITH DIFFERENTIAL/PLATELET
Abs Immature Granulocytes: 0.02 10*3/uL (ref 0.00–0.07)
Basophils Absolute: 0.1 10*3/uL (ref 0.0–0.1)
Basophils Relative: 1 %
Eosinophils Absolute: 0.3 10*3/uL (ref 0.0–0.5)
Eosinophils Relative: 4 %
HCT: 39.7 % (ref 36.0–46.0)
HEMOGLOBIN: 12.8 g/dL (ref 12.0–15.0)
Immature Granulocytes: 0 %
Lymphocytes Relative: 31 %
Lymphs Abs: 2.2 10*3/uL (ref 0.7–4.0)
MCH: 31.3 pg (ref 26.0–34.0)
MCHC: 32.2 g/dL (ref 30.0–36.0)
MCV: 97.1 fL (ref 80.0–100.0)
Monocytes Absolute: 1 10*3/uL (ref 0.1–1.0)
Monocytes Relative: 14 %
Neutro Abs: 3.5 10*3/uL (ref 1.7–7.7)
Neutrophils Relative %: 50 %
Platelets: 241 10*3/uL (ref 150–400)
RBC: 4.09 MIL/uL (ref 3.87–5.11)
RDW: 11.3 % — ABNORMAL LOW (ref 11.5–15.5)
WBC: 7 10*3/uL (ref 4.0–10.5)
nRBC: 0 % (ref 0.0–0.2)

## 2019-01-04 LAB — GASTROINTESTINAL PANEL BY PCR, STOOL (REPLACES STOOL CULTURE)
Adenovirus F40/41: NOT DETECTED
Astrovirus: NOT DETECTED
CYCLOSPORA CAYETANENSIS: NOT DETECTED
Campylobacter species: NOT DETECTED
Cryptosporidium: NOT DETECTED
Entamoeba histolytica: NOT DETECTED
Enteroaggregative E coli (EAEC): NOT DETECTED
Enteropathogenic E coli (EPEC): NOT DETECTED
Enterotoxigenic E coli (ETEC): NOT DETECTED
GIARDIA LAMBLIA: NOT DETECTED
Norovirus GI/GII: NOT DETECTED
Plesimonas shigelloides: NOT DETECTED
Rotavirus A: NOT DETECTED
SHIGELLA/ENTEROINVASIVE E COLI (EIEC): NOT DETECTED
Salmonella species: DETECTED — AB
Sapovirus (I, II, IV, and V): NOT DETECTED
Shiga like toxin producing E coli (STEC): NOT DETECTED
VIBRIO SPECIES: NOT DETECTED
Vibrio cholerae: NOT DETECTED
Yersinia enterocolitica: NOT DETECTED

## 2019-01-04 LAB — HIV ANTIBODY (ROUTINE TESTING W REFLEX): HIV Screen 4th Generation wRfx: NONREACTIVE

## 2019-01-04 LAB — URINALYSIS, ROUTINE W REFLEX MICROSCOPIC
Bilirubin Urine: NEGATIVE
Glucose, UA: NEGATIVE mg/dL
Ketones, ur: NEGATIVE mg/dL
Leukocytes, UA: NEGATIVE
Nitrite: NEGATIVE
Protein, ur: NEGATIVE mg/dL
Specific Gravity, Urine: 1.026 (ref 1.005–1.030)
pH: 5 (ref 5.0–8.0)

## 2019-01-04 LAB — BASIC METABOLIC PANEL
Anion gap: 6 (ref 5–15)
BUN: 6 mg/dL (ref 6–20)
CO2: 24 mmol/L (ref 22–32)
Calcium: 8.2 mg/dL — ABNORMAL LOW (ref 8.9–10.3)
Chloride: 109 mmol/L (ref 98–111)
Creatinine, Ser: 0.66 mg/dL (ref 0.44–1.00)
GFR calc Af Amer: >60 mL/min (ref >60)
GFR calc non Af Amer: >60 mL/min (ref >60)
GLUCOSE: 106 mg/dL — AB (ref 70–99)
Potassium: 3.7 mmol/L (ref 3.5–5.1)
Sodium: 139 mmol/L (ref 135–145)

## 2019-01-04 MED ORDER — ALBUTEROL SULFATE (2.5 MG/3ML) 0.083% IN NEBU: 2.5000 mg | INHALATION_SOLUTION | Freq: Four times a day (QID) | RESPIRATORY_TRACT | Status: DC | PRN

## 2019-01-04 MED ORDER — ONDANSETRON HCL 4 MG/2ML IJ SOLN
4.0000 mg | Freq: Four times a day (QID) | INTRAMUSCULAR | Status: DC | PRN
  Administered 2019-01-05: 4 mg via INTRAVENOUS
  Filled 2019-01-04: qty 2

## 2019-01-04 MED ORDER — SODIUM CHLORIDE 0.9 % IV SOLN
INTRAVENOUS | Status: AC
  Administered 2019-01-04: 02:00:00 via INTRAVENOUS

## 2019-01-04 MED ORDER — SODIUM CHLORIDE 0.9 % IV SOLN
2.0000 g | INTRAVENOUS | Status: DC
  Administered 2019-01-04 – 2019-01-06 (×3): 2 g via INTRAVENOUS
  Filled 2019-01-04: qty 20
  Filled 2019-01-04 (×2): qty 2
  Filled 2019-01-04: qty 20
  Filled 2019-01-04: qty 2

## 2019-01-04 MED ORDER — INFLUENZA VAC SPLIT QUAD 0.5 ML IM SUSY
0.5000 mL | PREFILLED_SYRINGE | INTRAMUSCULAR | Status: AC
  Administered 2019-01-07: 0.5 mL via INTRAMUSCULAR
  Filled 2019-01-04: qty 0.5

## 2019-01-04 MED ORDER — DICYCLOMINE HCL 20 MG PO TABS
20.0000 mg | ORAL_TABLET | Freq: Two times a day (BID) | ORAL | Status: DC
  Administered 2019-01-04 – 2019-01-07 (×7): 20 mg via ORAL
  Filled 2019-01-04 (×7): qty 1

## 2019-01-04 MED ORDER — ALBUTEROL SULFATE HFA 108 (90 BASE) MCG/ACT IN AERS: 2.0000 | INHALATION_SPRAY | Freq: Four times a day (QID) | RESPIRATORY_TRACT | Status: DC | PRN

## 2019-01-04 MED ORDER — ACETAMINOPHEN 325 MG PO TABS
650.0000 mg | ORAL_TABLET | Freq: Four times a day (QID) | ORAL | Status: DC | PRN
  Administered 2019-01-04 – 2019-01-06 (×6): 650 mg via ORAL
  Filled 2019-01-04 (×6): qty 2

## 2019-01-04 MED ORDER — LIP MEDEX EX OINT
TOPICAL_OINTMENT | CUTANEOUS | Status: AC
  Administered 2019-01-04: 10:00:00
  Filled 2019-01-04: qty 7

## 2019-01-04 MED ORDER — METRONIDAZOLE IN NACL 5-0.79 MG/ML-% IV SOLN
500.0000 mg | Freq: Three times a day (TID) | INTRAVENOUS | Status: DC
  Administered 2019-01-04 – 2019-01-07 (×11): 500 mg via INTRAVENOUS
  Filled 2019-01-04 (×10): qty 100

## 2019-01-04 MED ORDER — HYDRALAZINE HCL 20 MG/ML IJ SOLN
10.0000 mg | INTRAMUSCULAR | Status: DC | PRN
  Administered 2019-01-04 – 2019-01-07 (×5): 10 mg via INTRAVENOUS
  Filled 2019-01-04 (×5): qty 1

## 2019-01-04 MED ORDER — ONDANSETRON HCL 4 MG PO TABS: 4.0000 mg | ORAL_TABLET | Freq: Four times a day (QID) | ORAL | Status: DC | PRN

## 2019-01-04 MED ORDER — LISINOPRIL 20 MG PO TABS
20.0000 mg | ORAL_TABLET | Freq: Every day | ORAL | Status: DC
  Administered 2019-01-04 – 2019-01-07 (×4): 20 mg via ORAL
  Filled 2019-01-04 (×4): qty 1

## 2019-01-04 MED ORDER — LIP MEDEX EX OINT
TOPICAL_OINTMENT | CUTANEOUS | Status: AC
  Filled 2019-01-04: qty 7

## 2019-01-04 MED ORDER — PNEUMOCOCCAL VAC POLYVALENT 25 MCG/0.5ML IJ INJ
0.5000 mL | INJECTION | INTRAMUSCULAR | Status: AC
  Administered 2019-01-07: 0.5 mL via INTRAMUSCULAR
  Filled 2019-01-04: qty 0.5

## 2019-01-04 MED ORDER — FENTANYL CITRATE (PF) 100 MCG/2ML IJ SOLN
25.0000 ug | INTRAMUSCULAR | Status: DC | PRN
  Administered 2019-01-04: 50 ug via INTRAVENOUS
  Administered 2019-01-04: 25 ug via INTRAVENOUS
  Administered 2019-01-05 – 2019-01-07 (×11): 50 ug via INTRAVENOUS
  Filled 2019-01-04 (×13): qty 2

## 2019-01-04 MED ORDER — SODIUM CHLORIDE 0.9 % IV SOLN
INTRAVENOUS | Status: DC | PRN
  Administered 2019-01-04 – 2019-01-05 (×2): via INTRAVENOUS

## 2019-01-04 MED ORDER — ACETAMINOPHEN 650 MG RE SUPP: 650.0000 mg | Freq: Four times a day (QID) | RECTAL | Status: DC | PRN

## 2019-01-04 MED ORDER — PRAVASTATIN SODIUM 20 MG PO TABS
10.0000 mg | ORAL_TABLET | Freq: Every day | ORAL | Status: DC
  Administered 2019-01-04 – 2019-01-07 (×4): 10 mg via ORAL
  Filled 2019-01-04 (×4): qty 1

## 2019-01-04 MED ORDER — HYDRALAZINE HCL 20 MG/ML IJ SOLN: 20.0000 mg | Freq: Once | INTRAMUSCULAR | Status: DC

## 2019-01-04 NOTE — Progress Notes (Addendum)
I attempted to call report at 1455, but the secretary reported that nurse is currently at lunch. My number was provided to the secretary for the nurse to give me a call back. [phone]  The receiving RN took report at 1510. The pt will be transferred to room 1613.

## 2019-01-04 NOTE — Plan of Care (Signed)
48 year old female admitted this morning with nausea vomiting and bloody bowel movements.  History she has history of chronic diastolic CHF sarcoidosis with lung involvement history of stroke hypertension depression.  She was found to have colitis by CT scan.  This morning she is feeling hungry and asking for food.  I will place her on full liquid diet and see how she tolerates.  Continue IV antibiotics.  Will follow.

## 2019-01-04 NOTE — ED Notes (Signed)
ED TO INPATIENT HANDOFF REPORT  Name/Age/Gender [name] 48 y.o. female  Code Status    Code Status Orders  (From admission, onward)         Start     Ordered   01/04/19 0055  Full code  Continuous     01/04/19 0057        Code Status History    This patient has a current code status but no historical code status.      Home/SNF/Other Home  Chief Complaint Abdominal Pain   Level of Care/Admitting Diagnosis ED Disposition    ED Disposition Condition Comment   Admit  [facility] [[id number]]  Level of Care: Med-Surg [16]  Diagnosis: Colitis, acute [[id number]][[id number]]  Admitting Physician: Briscoe DeutscherPYD, TIMOTHY S [[id number]]  Attending Physician: Briscoe DeutscherPYD, TIMOTHY S [[id number]]  PT Class (Do Not Modify): Observation [104]  PT Acc Code (Do Not Modify): Observation [10022]       Medical History Past Medical History:  Diagnosis Date  . Allergic rhinitis due to pollen   . Allergic rhinitis due to pollen   . Bipolar disorder (HCC)   . CHF (congestive heart failure) (HCC)   . Cocaine dependence (HCC) 2012  . Depressive disorder, not elsewhere classified   . Hypertension   . Obesity, unspecified   . Osteoporosis, unspecified   . Other abnormal blood chemistry   . Other and unspecified hyperlipidemia   . Other and unspecified hyperlipidemia   . Other convulsions   . Other malaise and fatigue   . Reflux esophagitis   . Sarcoidosis   . Seizure disorder (HCC)   . Tobacco use disorder    stopped smoking  . Unspecified constipation   . Unspecified hereditary and idiopathic peripheral neuropathy   . Unspecified late effects of cerebrovascular disease   . Unspecified sleep apnea   . Unspecified vitamin D deficiency     Allergies Allergies  Allergen Reactions  . Quetiapine Fumarate Anaphylaxis  . Risperdal [Risperidone] Anaphylaxis  . Naproxen Rash and Other (See Comments)    Unknown reaction to patient  . Risperidone And Related Other (See  Comments)    Breast leakage  . Seroquel [Quetiapine Fumarate] Other (See Comments)    Caused breast leakage    IV Location/Drains/Wounds Patient Lines/Drains/Airways Status   Active Line/Drains/Airways    Name:   Placement date:   Placement time:   Site:   Days:   Peripheral IV 01/04/19 Left Hand   01/04/19    0049    Hand   less than 1          Labs/Imaging Results for orders placed or performed during the hospital encounter of 01/03/19 (from the past 48 hour(s))  Lipase, blood     Status: None   Collection Time: 01/03/19 10:04 PM  Result Value Ref Range   Lipase 32 11 - 51 U/L    Comment: Performed at [facility], [address] [place], [place] [id number]  Comprehensive metabolic panel     Status: Abnormal   Collection Time: 01/03/19 10:04 PM  Result Value Ref Range   Sodium 138 135 - 145 mmol/L   Potassium 3.8 3.5 - 5.1 mmol/L   Chloride 107 98 - 111 mmol/L   CO2 24 22 - 32 mmol/L   Glucose, Bld 94 70 - 99 mg/dL   BUN 7 6 - 20 mg/dL   Creatinine, Ser 1.300.78 0.44 - 1.00 mg/dL   Calcium 8.7 (L) 8.9 - 10.3  mg/dL   Total Protein 7.2 6.5 - 8.1 g/dL   Albumin 3.7 3.5 - 5.0 g/dL   AST 27 15 - 41 U/L   ALT 18 0 - 44 U/L   Alkaline Phosphatase 51 38 - 126 U/L   Total Bilirubin 0.8 0.3 - 1.2 mg/dL   GFR calc non Af Amer >60 >60 mL/min   GFR calc Af Amer >60 >60 mL/min   Anion gap 7 5 - 15    Comment: Performed at [facility], [address], [place], [place] [address]  CBC     Status: Abnormal   Collection Time: 01/03/19 10:04 PM  Result Value Ref Range   WBC 7.8 4.0 - 10.5 K/uL   RBC 4.48 3.87 - 5.11 MIL/uL   Hemoglobin 14.0 12.0 - 15.0 g/dL   HCT 58.8 50.2 - 77.4 %   MCV 96.7 80.0 - 100.0 fL   MCH 31.3 26.0 - 34.0 pg   MCHC 32.3 30.0 - 36.0 g/dL   RDW 12.8 (L) 78.6 - 76.7 %   Platelets 248 150 - 400 K/uL   nRBC 0.0 0.0 - 0.2 %    Comment: Performed at [facility], [address], [place], [place] [address]   I-Stat beta hCG blood, ED     Status: None   Collection Time: 01/03/19 10:14 PM  Result Value Ref Range   I-stat hCG, quantitative <5.0 <5 mIU/mL   Comment 3            Comment:   GEST. AGE      CONC.  (mIU/mL)   <=1 WEEK        5 - 50     2 WEEKS       50 - 500     3 WEEKS       100 - 10,000     4 WEEKS     1,000 - 30,000        FEMALE AND NON-PREGNANT FEMALE:     LESS THAN 5 mIU/mL    Ct Abdomen Pelvis W Contrast  Result Date: 01/02/2019 CLINICAL DATA:  Severe abdominal pain, nausea, vomiting and diarrhea. History of cholecystectomy, sarcoidosis and constipation. EXAM: CT ABDOMEN AND PELVIS WITH CONTRAST TECHNIQUE: Multidetector CT imaging of the abdomen and pelvis was performed using the standard protocol following bolus administration of intravenous contrast. CONTRAST:  ISOVUE-300 IOPAMIDOL (ISOVUE-300) INJECTION 61% COMPARISON:  None. FINDINGS: LOWER CHEST: Bibasilar scarring and linear calcifications versus surgical suture material in lingula. No pleural effusion or focal consolidation. Included heart size is normal. HEPATOBILIARY: Status post cholecystectomy.  Normal liver. PANCREAS: Normal. SPLEEN: Normal. ADRENALS/URINARY TRACT: Kidneys are orthotopic, demonstrating symmetric enhancement. No nephrolithiasis, hydronephrosis or solid renal masses. Too small to characterize RIGHT interpolar hypodensity. The unopacified ureters are normal in course and caliber. Delayed imaging through the kidneys demonstrates symmetric prompt contrast excretion within the proximal urinary collecting system. Urinary bladder is partially distended and unremarkable. Normal adrenal glands. STOMACH/BOWEL: Colonic wall thickening and edema from cecum through descending colon with pericolonic fat stranding. Mild descending and sigmoid colonic diverticulosis. Small amount of small bowel feces compatible with chronic stasis. The stomach, small bowel are normal in course and caliber without inflammatory changes.  Mild proximal appendix mural enhancement, likely secondary to adjacent colitis as remainder of appendix is normal. VASCULAR/LYMPHATIC: Aortoiliac vessels are normal in course and caliber. No lymphadenopathy by CT size criteria. REPRODUCTIVE: Normal. OTHER: Minimal free fluid in the pelvis. No focal fluid collection  or intraperitoneal free air. MUSCULOSKELETAL: Nonacute.  Small L4-5 and L5-S1 disc bulges. IMPRESSION: 1. Nonspecific colitis without complication. Electronically Signed   By: Awilda Metro M.D.   On: 01/02/2019 18:34   None  Pending Labs Unresulted Labs (From admission, onward)    Start     Ordered   01/04/19 0500  HIV antibody (Routine Testing)  Tomorrow morning,   R     01/04/19 0057   01/04/19 0500  Basic metabolic panel  Tomorrow morning,   R     01/04/19 0057   01/04/19 0500  CBC WITH DIFFERENTIAL  Tomorrow morning,   R     01/04/19 0057   01/04/19 0057  Gastrointestinal Panel by PCR , Stool  (Gastrointestinal Panel by PCR, Stool)  Once,   R     01/04/19 0057   01/03/19 2148  Urinalysis, Routine w reflex microscopic  ONCE - STAT,   STAT     01/03/19 2148          Vitals/Pain Today's Vitals   01/03/19 2146 01/04/19 0020 01/04/19 0110 01/04/19 0111  BP:  (!) 163/137 (!) 177/66   Pulse:  (!) 55  (!) 57  Resp:  17    Temp:      TempSrc:      SpO2:  97%  96%  Weight: 72.6 kg     Height: 5\' 3"  (1.6 m)     PainSc: 10-Worst pain ever       Isolation Precautions Enteric precautions (UV disinfection)  Medications Medications  sodium chloride 0.9 % bolus 1,000 mL (1,000 mLs Intravenous New Bag/Given 01/04/19 0050)  cefTRIAXone (ROCEPHIN) 2 g in sodium chloride 0.9 % 100 mL IVPB (2 g Intravenous New Bag/Given 01/04/19 0052)    And  metroNIDAZOLE (FLAGYL) IVPB 500 mg (has no administration in time range)  lisinopril (PRINIVIL,ZESTRIL) tablet 20 mg (has no administration in time range)  dicyclomine (BENTYL) tablet 20 mg (has no administration in time range)   pravastatin (PRAVACHOL) tablet 10 mg (has no administration in time range)  0.9 %  sodium chloride infusion (has no administration in time range)  acetaminophen (TYLENOL) tablet 650 mg (has no administration in time range)    Or  acetaminophen (TYLENOL) suppository 650 mg (has no administration in time range)  ondansetron (ZOFRAN) tablet 4 mg (has no administration in time range)    Or  ondansetron (ZOFRAN) injection 4 mg (has no administration in time range)  fentaNYL (SUBLIMAZE) injection 25-50 mcg (has no administration in time range)  cefTRIAXone (ROCEPHIN) 2 g in sodium chloride 0.9 % 100 mL IVPB (has no administration in time range)  metroNIDAZOLE (FLAGYL) IVPB 500 mg (has no administration in time range)  hydrALAZINE (APRESOLINE) injection 10 mg (has no administration in time range)  albuterol (PROVENTIL) (2.5 MG/3ML) 0.083% nebulizer solution 2.5 mg (has no administration in time range)  fentaNYL (SUBLIMAZE) injection 50 mcg (50 mcg Intravenous Given 01/04/19 0052)  ondansetron (ZOFRAN) injection 4 mg (4 mg Intravenous Given 01/04/19 0051)    Mobility walks

## 2019-01-04 NOTE — Progress Notes (Signed)
At 1500, [name] from lab at [facility] called regarding the pt's GI panel results, which are pos. For salmonella. MD notified.

## 2019-01-04 NOTE — Plan of Care (Signed)
  Problem: Activity: Goal: Ability to return to normal activity level will improve to the fullest extent possible by discharge Outcome: Progressing   Problem: Education: Goal: Knowledge of medication regimen will be met for pain relief regimen by discharge Outcome: Progressing Goal: Understanding of ways to prevent infection will improve by discharge Outcome: Progressing   Problem: Coping: Goal: Ability to verbalize feelings will improve by discharge Outcome: Progressing Goal: Family members realistic understanding of the patients condition will improve by discharge Outcome: Progressing   Problem: Medication: Goal: Compliance with prescribed medication regimen will improve by discharge Outcome: Progressing

## 2019-01-04 NOTE — H&P (Signed)
History and Physical    [name] ZOX:[id number] DOB: May 02, 1971 DOA: 01/03/2019  PCP: Kirt Boys, DO   Patient coming from: Home   Chief Complaint: Lower abdominal pain, N/V, rectal bleeding    HPI: [name] is a 48 y.o. female with medical history significant for chronic diastolic CHF, sarcoidosis with pulmonary involvement, hypertension, history of CVA, and depression, now presenting to the emergency department for evaluation of lower abdominal pain, nausea, vomiting, and rectal bleeding.  Patient reports that she developed cramping pain in the lower abdomen on 01/01/2019, waxing and waning, severe at times, and associated with nausea with nonbloody vomiting and multiple episodes of bright red blood per rectum.  She has had some chills, but has not checked her temperature.  Denies sick contacts or long distance travel.  Has never experienced these types of symptoms previously.  Was seen in the emergency department on 01/02/2019 for evaluation of the same complaints, was discharged home with Zofran, but has continued to have pain that becomes severe at times, has had additional rectal bleeding, and ongoing nausea with nonbloody vomiting.  She denies chest pain, palpitations, cough, or shortness of breath.  No headache, acute change in vision or hearing, or acute focal numbness or weakness  ED Course: Upon arrival to the ED, patient is found to be afebrile, saturating well on room air, bradycardic in the mid 50s, and hypertensive to 200/100.  CT from the earlier ED visit revealed a nonspecific colitis without complication.  Chemistry panel and CBC are unremarkable.  Patient was given 2 L normal saline, fentanyl, Zofran, Rocephin, and Flagyl in the ED.  Blood pressure has improved some, pain is also improved some, but she does continue to complain of pain, does not feel that she will be able to tolerate her oral medications at home, and will be observed for further evaluation and  management.  Review of Systems:  All other systems reviewed and apart from HPI, are negative.  Past Medical History:  Diagnosis Date  . Allergic rhinitis due to pollen   . Allergic rhinitis due to pollen   . Bipolar disorder (HCC)   . CHF (congestive heart failure) (HCC)   . Cocaine dependence (HCC) 2012  . Depressive disorder, not elsewhere classified   . Hypertension   . Obesity, unspecified   . Osteoporosis, unspecified   . Other abnormal blood chemistry   . Other and unspecified hyperlipidemia   . Other and unspecified hyperlipidemia   . Other convulsions   . Other malaise and fatigue   . Reflux esophagitis   . Sarcoidosis   . Seizure disorder (HCC)   . Tobacco use disorder    stopped smoking  . Unspecified constipation   . Unspecified hereditary and idiopathic peripheral neuropathy   . Unspecified late effects of cerebrovascular disease   . Unspecified sleep apnea   . Unspecified vitamin D deficiency     Past Surgical History:  Procedure Laterality Date  . CHOLECYSTECTOMY    . FRACTURE SURGERY     right arm  . LUNG BIOPSY       reports that she quit smoking about 5 years ago. She has never used smokeless tobacco. She reports that she does not drink alcohol or use drugs.  Allergies  Allergen Reactions  . Quetiapine Fumarate Anaphylaxis  . Risperdal [Risperidone] Anaphylaxis  . Naproxen Rash and Other (See Comments)    Unknown reaction to patient  . Risperidone And Related Other (See Comments)    Breast leakage  .  Seroquel [Quetiapine Fumarate] Other (See Comments)    Caused breast leakage    Family History  Problem Relation Age of Onset  . Diabetes Mother   . Hypertension Mother   . Hypertension Father   . Stroke Father   . Cancer Paternal Uncle        breast     Prior to Admission medications   Medication Sig Start Date End Date Taking? Authorizing Provider  albuterol (PROVENTIL HFA;VENTOLIN HFA) 108 (90 BASE) MCG/ACT inhaler Inhale 2 puffs  into the lungs every 6 (six) hours as needed for wheezing or shortness of breath. 08/09/14  Yes Oneal Grout, MD  aspirin 81 MG tablet Take 1 tablet (81 mg total) by mouth daily. 04/05/14  Yes Oneal Grout, MD  dicyclomine (BENTYL) 20 MG tablet Take 1 tablet (20 mg total) by mouth 2 (two) times daily. 01/02/19  Yes Fawze, Mina A, PA-C  lisinopril (PRINIVIL,ZESTRIL) 20 MG tablet Take one tablet by mouth once daily for blood pressure 08/09/14  Yes Pandey, Mahima, MD  citalopram (CELEXA) 20 MG tablet Take 1 tablet (20 mg total) by mouth daily. Patient not taking: Reported on 04/12/2015 12/20/14   Oneal Grout, MD  clopidogrel (PLAVIX) 75 MG tablet Take one tablet once daily Patient not taking: Reported on 04/12/2015 04/05/14   Oneal Grout, MD  dexlansoprazole (DEXILANT) 60 MG capsule TAKE ONE CAPSULE BY MOUTH EVERY DAY Patient not taking: Reported on 04/12/2015 01/18/15   Kirt Boys, DO  lubiprostone (AMITIZA) 8 MCG capsule TAKE ONE CAPSULE BY MOUTH TWICE DAILY FOR  CONSTIPATION Patient not taking: Reported on 04/12/2015 04/27/14   Oneal Grout, MD  metoprolol succinate (TOPROL-XL) 25 MG 24 hr tablet Take 0.5 tablets (12.5 mg total) by mouth daily. Patient not taking: Reported on 04/12/2015 01/18/15   Kirt Boys, DO  ondansetron (ZOFRAN ODT) 4 MG disintegrating tablet Take 1 tablet (4 mg total) by mouth every 8 (eight) hours as needed for nausea or vomiting. 01/02/19   Fawze, Mina A, PA-C  pravastatin (PRAVACHOL) 10 MG tablet Take 1 tablet (10 mg total) by mouth daily. Patient not taking: Reported on 04/12/2015 08/09/14   Oneal Grout, MD  ranitidine (ZANTAC) 150 MG tablet Take 1 tablet (150 mg total) by mouth 2 (two) times daily. Patient not taking: Reported on 04/12/2015 12/20/14   Oneal Grout, MD    Physical Exam: Vitals:   01/03/19 2145 01/03/19 2146 01/04/19 0020  BP: (!) 185/105  (!) 163/137  Pulse: (!) 58  (!) 55  Resp: 20  17  Temp: 98.5 F (36.9 C)    TempSrc: Oral    SpO2: 97%   97%  Weight:  72.6 kg   Height:  5\' 3"  (1.6 m)     Constitutional: NAD, appears uncomfortable  Eyes: PERTLA, lids and conjunctivae normal ENMT: Mucous membranes are moist. Posterior pharynx clear of any exudate or lesions.   Neck: normal, supple, no masses, no thyromegaly Respiratory: clear to auscultation bilaterally, no wheezing, no crackles. Normal respiratory effort.   Cardiovascular: S1 & S2 heard, regular rate and rhythm. No extremity edema.   Abdomen: No distension, soft, tender in lower quadrants without rebound pain or guarding. Bowel sounds active.  Musculoskeletal: no clubbing / cyanosis. No joint deformity upper and lower extremities.    Skin: no significant rashes, lesions, ulcers. Warm, dry, well-perfused. Neurologic: No facial asymmetry. Sensation intact. Moving all extremities.  Psychiatric: Alert and oriented x 3. Calm, cooperative.    Labs on Admission: I have personally reviewed following  labs and imaging studies  CBC: Recent Labs  Lab 01/02/19 1616 01/03/19 2204  WBC 8.4 7.8  HGB 14.9 14.0  HCT 45.8 43.3  MCV 96.4 96.7  PLT 282 248   Basic Metabolic Panel: Recent Labs  Lab 01/02/19 1616 01/03/19 2204  NA 139 138  K 3.9 3.8  CL 107 107  CO2 23 24  GLUCOSE 103* 94  BUN 9 7  CREATININE 0.77 0.78  CALCIUM 8.9 8.7*   GFR: Estimated Creatinine Clearance: 82.1 mL/min (by C-G formula based on SCr of 0.78 mg/dL). Liver Function Tests: Recent Labs  Lab 01/02/19 1616 01/03/19 2204  AST 16 27  ALT 10 18  ALKPHOS 58 51  BILITOT 0.7 0.8  PROT 7.9 7.2  ALBUMIN 4.1 3.7   Recent Labs  Lab 01/02/19 1616 01/03/19 2204  LIPASE 38 32   No results for input(s): AMMONIA in the last 168 hours. Coagulation Profile: No results for input(s): INR, PROTIME in the last 168 hours. Cardiac Enzymes: No results for input(s): CKTOTAL, CKMB, CKMBINDEX, TROPONINI in the last 168 hours. BNP (last 3 results) No results for input(s): PROBNP in the last 8760  hours. HbA1C: No results for input(s): HGBA1C in the last 72 hours. CBG: No results for input(s): GLUCAP in the last 168 hours. Lipid Profile: No results for input(s): CHOL, HDL, LDLCALC, TRIG, CHOLHDL, LDLDIRECT in the last 72 hours. Thyroid Function Tests: No results for input(s): TSH, T4TOTAL, FREET4, T3FREE, THYROIDAB in the last 72 hours. Anemia Panel: No results for input(s): VITAMINB12, FOLATE, FERRITIN, TIBC, IRON, RETICCTPCT in the last 72 hours. Urine analysis:    Component Value Date/Time   COLORURINE YELLOW 01/02/2019 1609   APPEARANCEUR CLEAR 01/02/2019 1609   LABSPEC 1.025 01/02/2019 1609   PHURINE 5.0 01/02/2019 1609   GLUCOSEU NEGATIVE 01/02/2019 1609   HGBUR MODERATE (A) 01/02/2019 1609   BILIRUBINUR NEGATIVE 01/02/2019 1609   KETONESUR NEGATIVE 01/02/2019 1609   PROTEINUR NEGATIVE 01/02/2019 1609   UROBILINOGEN 0.2 04/12/2015 1825   NITRITE NEGATIVE 01/02/2019 1609   LEUKOCYTESUR NEGATIVE 01/02/2019 1609   Sepsis Labs: @LABRCNTIP (procalcitonin:4,lacticidven:4) )No results found for this or any previous visit (from the past 240 hour(s)).   Radiological Exams on Admission: Ct Abdomen Pelvis W Contrast  Result Date: 01/02/2019 CLINICAL DATA:  Severe abdominal pain, nausea, vomiting and diarrhea. History of cholecystectomy, sarcoidosis and constipation. EXAM: CT ABDOMEN AND PELVIS WITH CONTRAST TECHNIQUE: Multidetector CT imaging of the abdomen and pelvis was performed using the standard protocol following bolus administration of intravenous contrast. CONTRAST:  ISOVUE-300 IOPAMIDOL (ISOVUE-300) INJECTION 61% COMPARISON:  None. FINDINGS: LOWER CHEST: Bibasilar scarring and linear calcifications versus surgical suture material in lingula. No pleural effusion or focal consolidation. Included heart size is normal. HEPATOBILIARY: Status post cholecystectomy.  Normal liver. PANCREAS: Normal. SPLEEN: Normal. ADRENALS/URINARY TRACT: Kidneys are orthotopic, demonstrating  symmetric enhancement. No nephrolithiasis, hydronephrosis or solid renal masses. Too small to characterize RIGHT interpolar hypodensity. The unopacified ureters are normal in course and caliber. Delayed imaging through the kidneys demonstrates symmetric prompt contrast excretion within the proximal urinary collecting system. Urinary bladder is partially distended and unremarkable. Normal adrenal glands. STOMACH/BOWEL: Colonic wall thickening and edema from cecum through descending colon with pericolonic fat stranding. Mild descending and sigmoid colonic diverticulosis. Small amount of small bowel feces compatible with chronic stasis. The stomach, small bowel are normal in course and caliber without inflammatory changes. Mild proximal appendix mural enhancement, likely secondary to adjacent colitis as remainder of appendix is normal. VASCULAR/LYMPHATIC: Aortoiliac vessels  are normal in course and caliber. No lymphadenopathy by CT size criteria. REPRODUCTIVE: Normal. OTHER: Minimal free fluid in the pelvis. No focal fluid collection or intraperitoneal free air. MUSCULOSKELETAL: Nonacute.  Small L4-5 and L5-S1 disc bulges. IMPRESSION: 1. Nonspecific colitis without complication. Electronically Signed   By: Awilda Metroourtnay  Bloomer M.D.   On: 01/02/2019 18:34    EKG: Not performed.   Assessment/Plan   1. Acute colitis  - Presents with N/V and bloody stool   - CT reveals a non-specific colitis without perforation or abscess  - No peritoneal signs on exam, Hgb remains normal  - Treated with IVF, antiemetics, analgesia, and empiric antibiotics in ED  - Send stool for GI pathogen panel, continue IVF hydration, Rocephin and Flagyl, pain-control, monitor electrolytes    2. Hypertension with hypertensive urgency  - BP as high as 220/110 in ED, asymptomatic and likely secondary to pain and N/V  - Continue lisinopril, continue pain-control, use hydralazine IVP's as needed    3. Chronic diastolic CHF  - Hypovolemic on  admission in setting of N/V  - Given 1 liter NS in ED and continued on IVF hydration  - Follow daily wts, continue ACE-i; beta-blocker held for bradycardia   4. Sarcoidosis  - Reports lung involvement, currently quiescent   5. History of CVA  - She stopped her statin d/t cost, will resume and consult with case manager for possible resources   - Hold ASA in light of bloody diarrhea     DVT prophylaxis: SCD's  Code Status: Full  Family Communication: Discussed with patient  Consults called: none Admission status: Observation     Briscoe Deutscherimothy S Opyd, MD [facility] Pager [phone]  If 7PM-7AM, please contact night-coverage [email or web address] Password [email or web address]  01/04/2019, 12:58 AM

## 2019-01-04 NOTE — ED Provider Notes (Signed)
[facility]-EMERGENCY DEPT Provider Note   CSN: [id number] Arrival date & time: 01/03/19  2133  Time seen 11:50 PM  History   Chief Complaint Chief Complaint  Patient presents with  . Abdominal Pain    HPI [name] is a 48 y.o. female.  HPI   patient was seen in the ED yesterday for abdominal pain and was diagnosed with colitis.  She was discharged home with Zofran.  She states she has vomited 5 times today and still has nausea.  She was having rectal bleeding yesterday but has not had any bowel movements today and no more bleeding.  She does states she is having lots of flatus all day.  She states when she tries to eat or drink her pain gets worse.  She also states changing positions makes the pain worse.  She denies any fever.  She states her pain is constant but it waxes and wanes like contractions that last about 15 minutes.  She indicates her pain is in the left lower quadrant.  PCP Kirt Boysarter, Monica, DO   Past Medical History:  Diagnosis Date  . Allergic rhinitis due to pollen   . Allergic rhinitis due to pollen   . Bipolar disorder (HCC)   . CHF (congestive heart failure) (HCC)   . Cocaine dependence (HCC) 2012  . Depressive disorder, not elsewhere classified   . Hypertension   . Obesity, unspecified   . Osteoporosis, unspecified   . Other abnormal blood chemistry   . Other and unspecified hyperlipidemia   . Other and unspecified hyperlipidemia   . Other convulsions   . Other malaise and fatigue   . Reflux esophagitis   . Sarcoidosis   . Seizure disorder (HCC)   . Tobacco use disorder    stopped smoking  . Unspecified constipation   . Unspecified hereditary and idiopathic peripheral neuropathy   . Unspecified late effects of cerebrovascular disease   . Unspecified sleep apnea   . Unspecified vitamin D deficiency     Patient Active Problem List   Diagnosis Date Noted  . Hypertensive urgency 01/04/2019  . Acute colitis 01/04/2019  .  Colitis, acute 01/04/2019  . Meralgia paresthetica of right side 04/12/2015  . Hyperlipidemia LDL goal <100 12/20/2014  . Chronic diastolic congestive heart failure (HCC) 04/27/2014  . s 04/27/2014  . 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 Encounter for therapeutic drug monitoring 04/27/2014  . COPD (chronic obstructive pulmonary disease) (HCC) 04/05/2014  . CHF (congestive heart failure) (HCC) 04/05/2014  . Muscle weakness of right arm 04/05/2014  . Tinea versicolor 09/08/2013  . Hyperlipidemia with target LDL less than 130 06/08/2013  . Peripheral neuropathy 06/08/2013  . Essential hypertension, benign 06/08/2013  . Late effects of CVA (cerebrovascular accident) 06/08/2013  . GERD (gastroesophageal reflux disease) 06/08/2013  . Constipation 06/08/2013  . Chronic low back pain 06/08/2013  . Insomnia 06/08/2013    Past Surgical History:  Procedure Laterality Date  . CHOLECYSTECTOMY    . FRACTURE SURGERY     right arm  . LUNG BIOPSY       OB History   No obstetric history on file.      Home Medications    Prior to Admission medications   Medication Sig Start Date End Date Taking? Authorizing Provider  albuterol (PROVENTIL HFA;VENTOLIN HFA) 108 (90 BASE) MCG/ACT inhaler Inhale 2 puffs into the lungs every 6 (six) hours as needed for wheezing or shortness of breath. 08/09/14  Yes Oneal GroutPandey, Mahima, MD  aspirin 81 MG tablet Take 1 tablet (81 mg total) by mouth daily. 04/05/14  Yes Oneal GroutPandey, Mahima, MD  dicyclomine (BENTYL) 20 MG tablet Take 1 tablet (20 mg total) by mouth 2 (two) times daily. 01/02/19  Yes Fawze, Mina A, PA-C  lisinopril (PRINIVIL,ZESTRIL) 20 MG tablet Take one tablet by mouth once daily for blood pressure 08/09/14  Yes Pandey, Mahima, MD  amLODipine (NORVASC) 5 MG tablet Take one tablet once daily for blood pressure and heart Patient not taking: Reported on 04/12/2015 01/18/15   Kirt Boysarter, Monica, DO  citalopram (CELEXA) 20 MG tablet Take 1 tablet (20 mg total) by mouth  daily. Patient not taking: Reported on 04/12/2015 12/20/14   Oneal GroutPandey, Mahima, MD  clopidogrel (PLAVIX) 75 MG tablet Take one tablet once daily Patient not taking: Reported on 04/12/2015 04/05/14   Oneal GroutPandey, Mahima, MD  dexlansoprazole (DEXILANT) 60 MG capsule TAKE ONE CAPSULE BY MOUTH EVERY DAY Patient not taking: Reported on 04/12/2015 01/18/15   Kirt Boysarter, Monica, DO  gabapentin (NEURONTIN) 100 MG capsule Take 1 capsule (100 mg total) by mouth 3 (three) times daily. Take 100 mg TID for first 1 week, if not improved increase to 200 mg TID Patient not taking: Reported on 01/02/2019 04/12/15   Pricilla LovelessGoldston, Scott, MD  lubiprostone (AMITIZA) 8 MCG capsule TAKE ONE CAPSULE BY MOUTH TWICE DAILY FOR  CONSTIPATION Patient not taking: Reported on 04/12/2015 04/27/14   Oneal GroutPandey, Mahima, MD  metoprolol succinate (TOPROL-XL) 25 MG 24 hr tablet Take 0.5 tablets (12.5 mg total) by mouth daily. Patient not taking: Reported on 04/12/2015 01/18/15   Kirt Boysarter, Monica, DO  ondansetron (ZOFRAN ODT) 4 MG disintegrating tablet Take 1 tablet (4 mg total) by mouth every 8 (eight) hours as needed for nausea or vomiting. 01/02/19   Fawze, Mina A, PA-C  pravastatin (PRAVACHOL) 10 MG tablet Take 1 tablet (10 mg total) by mouth daily. Patient not taking: Reported on 04/12/2015 08/09/14   Oneal GroutPandey, Mahima, MD  ranitidine (ZANTAC) 150 MG tablet Take 1 tablet (150 mg total) by mouth 2 (two) times daily. Patient not taking: Reported on 04/12/2015 12/20/14   Oneal GroutPandey, Mahima, MD  traMADol (ULTRAM) 50 MG tablet TAKE ONE TABLET BY MOUTH EVERY 6 HOURS AS NEEDED FOR PAIN AS DIRECTED (ONE  TABLET  FOR  PAIN  1-5/10  AND  2  TABLETS  FOR  PAIN  6-10/10) Patient not taking: Reported on 04/12/2015 03/03/15   Kirt Boysarter, Monica, DO  zolpidem (AMBIEN) 5 MG tablet Take 1 tablet (5 mg total) by mouth at bedtime as needed. for sleep Patient not taking: Reported on 01/02/2019 09/15/15   Sharon SellerEubanks, Jessica K, NP    Family History Family History  Problem Relation Age of Onset  .  Diabetes Mother   . Hypertension Mother   . Hypertension Father   . Stroke Father   . Cancer Paternal Uncle        breast    Social History Social History   Tobacco Use  . Smoking status: Former Smoker    Last attempt to quit: 05/30/2013    Years since quitting: 5.6  . Smokeless tobacco: Never Used  Substance Use Topics  . Alcohol use: No  . Drug use: No     Allergies   Quetiapine fumarate; Risperdal [risperidone]; Naproxen; Risperidone and related; and Seroquel [quetiapine fumarate]   Review of Systems Review of Systems  All other systems reviewed and are negative.    Physical Exam Updated Vital Signs BP (!) 163/137   Pulse (!) 55  Temp 98.5 F (36.9 C) (Oral)   Resp 17   Ht 5\' 3"  (1.6 m)   Wt 72.6 kg   SpO2 97%   BMI 28.34 kg/m   Physical Exam Vitals signs and nursing note reviewed.  Constitutional:      General: She is in acute distress.     Appearance: Normal appearance. She is well-developed. She is not ill-appearing or toxic-appearing.  HENT:     Head: Normocephalic and atraumatic.     Right Ear: External ear normal.     Left Ear: External ear normal.     Nose: Nose normal. No mucosal edema or rhinorrhea.     Mouth/Throat:     Mouth: Mucous membranes are dry.     Dentition: No dental abscesses.     Pharynx: No uvula swelling.  Eyes:     Conjunctiva/sclera: Conjunctivae normal.     Pupils: Pupils are equal, round, and reactive to light.  Neck:     Musculoskeletal: Full passive range of motion without pain, normal range of motion and neck supple.  Cardiovascular:     Rate and Rhythm: Normal rate and regular rhythm.     Heart sounds: Normal heart sounds. No murmur. No friction rub. No gallop.   Pulmonary:     Effort: Pulmonary effort is normal. No respiratory distress.     Breath sounds: Normal breath sounds. No wheezing, rhonchi or rales.  Chest:     Chest wall: No tenderness or crepitus.  Abdominal:     General: Bowel sounds are normal.  There is no distension.     Palpations: Abdomen is soft.     Tenderness: There is no abdominal tenderness. There is no guarding or rebound.       Comments: Abdomen is soft without being rigid, there is no Rovsing sign.  Musculoskeletal: Normal range of motion.        General: No tenderness.     Comments: Moves all extremities well.   Skin:    General: Skin is warm and dry.     Coloration: Skin is not pale.     Findings: No erythema or rash.  Neurological:     Mental Status: She is alert and oriented to person, place, and time.     Cranial Nerves: No cranial nerve deficit.  Psychiatric:        Mood and Affect: Mood is not anxious.        Speech: Speech normal.        Behavior: Behavior normal.      ED Treatments / Results  Labs (all labs ordered are listed, but only abnormal results are displayed) Results for orders placed or performed during the hospital encounter of 01/03/19  Lipase, blood  Result Value Ref Range   Lipase 32 11 - 51 U/L  Comprehensive metabolic panel  Result Value Ref Range   Sodium 138 135 - 145 mmol/L   Potassium 3.8 3.5 - 5.1 mmol/L   Chloride 107 98 - 111 mmol/L   CO2 24 22 - 32 mmol/L   Glucose, Bld 94 70 - 99 mg/dL   BUN 7 6 - 20 mg/dL   Creatinine, Ser 0.22 0.44 - 1.00 mg/dL   Calcium 8.7 (L) 8.9 - 10.3 mg/dL   Total Protein 7.2 6.5 - 8.1 g/dL   Albumin 3.7 3.5 - 5.0 g/dL   AST 27 15 - 41 U/L   ALT 18 0 - 44 U/L   Alkaline Phosphatase 51 38 - 126 U/L  Total Bilirubin 0.8 0.3 - 1.2 mg/dL   GFR calc non Af Amer >60 >60 mL/min   GFR calc Af Amer >60 >60 mL/min   Anion gap 7 5 - 15  CBC  Result Value Ref Range   WBC 7.8 4.0 - 10.5 K/uL   RBC 4.48 3.87 - 5.11 MIL/uL   Hemoglobin 14.0 12.0 - 15.0 g/dL   HCT 36.1 44.3 - 15.4 %   MCV 96.7 80.0 - 100.0 fL   MCH 31.3 26.0 - 34.0 pg   MCHC 32.3 30.0 - 36.0 g/dL   RDW 00.8 (L) 67.6 - 19.5 %   Platelets 248 150 - 400 K/uL   nRBC 0.0 0.0 - 0.2 %  I-Stat beta hCG blood, ED  Result Value Ref  Range   I-stat hCG, quantitative <5.0 <5 mIU/mL   Comment 3           Laboratory interpretation all normal     EKG None  Radiology Ct Abdomen Pelvis W Contrast  Result Date: 01/02/2019 CLINICAL DATA:  Severe abdominal pain, nausea, vomiting and diarrhea. History of cholecystectomy, sarcoidosis and constipation. IMPRESSION: 1. Nonspecific colitis without complication. Electronically Signed   By: Awilda Metro M.D.   On: 01/02/2019 18:34    Procedures Procedures (including critical care time)  Medications Ordered in ED Medications  sodium chloride 0.9 % bolus 1,000 mL (has no administration in time range)  sodium chloride 0.9 % bolus 1,000 mL (has no administration in time range)  fentaNYL (SUBLIMAZE) injection 50 mcg (has no administration in time range)  ondansetron (ZOFRAN) injection 4 mg (has no administration in time range)  cefTRIAXone (ROCEPHIN) 2 g in sodium chloride 0.9 % 100 mL IVPB (has no administration in time range)    And  metroNIDAZOLE (FLAGYL) IVPB 500 mg (has no administration in time range)     Initial Impression / Assessment and Plan / ED Course  I have reviewed the triage vital signs and the nursing notes.  Pertinent labs & imaging results that were available during my care of the patient were reviewed by me and considered in my medical decision making (see chart for details).    We discussed her laboratory test results which were all normal and similar to her labs done yesterday.  Patient seems absolutely miserable, she states she cannot take it anymore.  On her exam she does not have an acute abdomen, I am not concerned about perforation, I just feel like she needs IV fluids, IV pain and nausea medicine and to get started on antibiotics.  We discussed admission and she is agreeable.  12:23 AM Dr. Antionette Char, hospitalist will admit   Final Clinical Impressions(s) / ED Diagnoses   Final diagnoses:  Colitis  Nausea and vomiting, intractability of vomiting  not specified, unspecified vomiting type    Plan admission  Devoria Albe, MD, Concha Pyo, MD 01/04/19 Moses Manners

## 2019-01-05 DIAGNOSIS — I5032 Chronic diastolic (congestive) heart failure: Secondary | ICD-10-CM | POA: Diagnosis present

## 2019-01-05 DIAGNOSIS — A020 Salmonella enteritis: Secondary | ICD-10-CM | POA: Diagnosis present

## 2019-01-05 DIAGNOSIS — E785 Hyperlipidemia, unspecified: Secondary | ICD-10-CM | POA: Diagnosis present

## 2019-01-05 DIAGNOSIS — F319 Bipolar disorder, unspecified: Secondary | ICD-10-CM | POA: Diagnosis present

## 2019-01-05 DIAGNOSIS — Z9049 Acquired absence of other specified parts of digestive tract: Secondary | ICD-10-CM | POA: Diagnosis not present

## 2019-01-05 DIAGNOSIS — I160 Hypertensive urgency: Secondary | ICD-10-CM | POA: Diagnosis present

## 2019-01-05 DIAGNOSIS — Z79899 Other long term (current) drug therapy: Secondary | ICD-10-CM | POA: Diagnosis not present

## 2019-01-05 DIAGNOSIS — R1030 Lower abdominal pain, unspecified: Secondary | ICD-10-CM | POA: Diagnosis present

## 2019-01-05 DIAGNOSIS — Z23 Encounter for immunization: Secondary | ICD-10-CM | POA: Diagnosis present

## 2019-01-05 DIAGNOSIS — K529 Noninfective gastroenteritis and colitis, unspecified: Secondary | ICD-10-CM | POA: Diagnosis not present

## 2019-01-05 DIAGNOSIS — D860 Sarcoidosis of lung: Secondary | ICD-10-CM | POA: Diagnosis present

## 2019-01-05 DIAGNOSIS — Z888 Allergy status to other drugs, medicaments and biological substances status: Secondary | ICD-10-CM | POA: Diagnosis not present

## 2019-01-05 DIAGNOSIS — G609 Hereditary and idiopathic neuropathy, unspecified: Secondary | ICD-10-CM | POA: Diagnosis present

## 2019-01-05 DIAGNOSIS — E861 Hypovolemia: Secondary | ICD-10-CM | POA: Diagnosis present

## 2019-01-05 DIAGNOSIS — E876 Hypokalemia: Secondary | ICD-10-CM | POA: Diagnosis present

## 2019-01-05 DIAGNOSIS — G40909 Epilepsy, unspecified, not intractable, without status epilepticus: Secondary | ICD-10-CM | POA: Diagnosis present

## 2019-01-05 DIAGNOSIS — M810 Age-related osteoporosis without current pathological fracture: Secondary | ICD-10-CM | POA: Diagnosis present

## 2019-01-05 DIAGNOSIS — Z8673 Personal history of transient ischemic attack (TIA), and cerebral infarction without residual deficits: Secondary | ICD-10-CM | POA: Diagnosis not present

## 2019-01-05 DIAGNOSIS — Z87891 Personal history of nicotine dependence: Secondary | ICD-10-CM | POA: Diagnosis not present

## 2019-01-05 DIAGNOSIS — Z886 Allergy status to analgesic agent status: Secondary | ICD-10-CM | POA: Diagnosis not present

## 2019-01-05 DIAGNOSIS — Z823 Family history of stroke: Secondary | ICD-10-CM | POA: Diagnosis not present

## 2019-01-05 DIAGNOSIS — Z8249 Family history of ischemic heart disease and other diseases of the circulatory system: Secondary | ICD-10-CM | POA: Diagnosis not present

## 2019-01-05 DIAGNOSIS — Z7982 Long term (current) use of aspirin: Secondary | ICD-10-CM | POA: Diagnosis not present

## 2019-01-05 DIAGNOSIS — G4730 Sleep apnea, unspecified: Secondary | ICD-10-CM | POA: Diagnosis present

## 2019-01-05 DIAGNOSIS — I110 Hypertensive heart disease with heart failure: Secondary | ICD-10-CM | POA: Diagnosis present

## 2019-01-05 LAB — BASIC METABOLIC PANEL
ANION GAP: 7 (ref 5–15)
BUN: <5 mg/dL — ABNORMAL LOW (ref 6–20)
CO2: 26 mmol/L (ref 22–32)
Calcium: 8.5 mg/dL — ABNORMAL LOW (ref 8.9–10.3)
Chloride: 105 mmol/L (ref 98–111)
Creatinine, Ser: 0.76 mg/dL (ref 0.44–1.00)
GFR calc non Af Amer: >60 mL/min (ref >60)
GLUCOSE: 108 mg/dL — AB (ref 70–99)
Potassium: 3.2 mmol/L — ABNORMAL LOW (ref 3.5–5.1)
Sodium: 138 mmol/L (ref 135–145)

## 2019-01-05 LAB — CBC
HCT: 39.2 % (ref 36.0–46.0)
Hemoglobin: 12.5 g/dL (ref 12.0–15.0)
MCH: 30.8 pg (ref 26.0–34.0)
MCHC: 31.9 g/dL (ref 30.0–36.0)
MCV: 96.6 fL (ref 80.0–100.0)
PLATELETS: 232 10*3/uL (ref 150–400)
RBC: 4.06 MIL/uL (ref 3.87–5.11)
RDW: 11.4 % — ABNORMAL LOW (ref 11.5–15.5)
WBC: 5.9 10*3/uL (ref 4.0–10.5)
nRBC: 0 % (ref 0.0–0.2)

## 2019-01-05 MED ORDER — LIDOCAINE VISCOUS HCL 2 % MT SOLN
15.0000 mL | Freq: Once | OROMUCOSAL | Status: AC
  Administered 2019-01-05: 15 mL via ORAL
  Filled 2019-01-05: qty 15

## 2019-01-05 MED ORDER — HYDRALAZINE HCL 20 MG/ML IJ SOLN
10.0000 mg | Freq: Once | INTRAMUSCULAR | Status: AC
  Administered 2019-01-05: 10 mg via INTRAVENOUS
  Filled 2019-01-05: qty 1

## 2019-01-05 MED ORDER — ALUM & MAG HYDROXIDE-SIMETH 200-200-20 MG/5ML PO SUSP
30.0000 mL | Freq: Once | ORAL | Status: AC
  Administered 2019-01-05: 30 mL via ORAL
  Filled 2019-01-05: qty 30

## 2019-01-05 NOTE — Care Management Important Message (Deleted)
Important Message  Patient Details  Name: Arianie Thorn MRN: [id number] Date of Birth: [birth year]   Medicare Important Message Given:       Bartholome Bill, RN 01/05/2019, 1:42 PM

## 2019-01-05 NOTE — Progress Notes (Addendum)
PROGRESS NOTE    Alicia SquibbRegina Estrada  ZOX:[id number] DOB: 17-Aug-1971 DOA: 01/03/2019 PCP: Kirt Boysarter, Monica, DO    Brief Narrative: 48 y.o. female with medical history significant for chronic diastolic CHF, sarcoidosis with pulmonary involvement, hypertension, history of CVA, and depression, now presenting to the emergency department for evaluation of lower abdominal pain, nausea, vomiting, and rectal bleeding.  Patient reports that she developed cramping pain in the lower abdomen on 01/01/2019, waxing and waning, severe at times, and associated with nausea with nonbloody vomiting and multiple episodes of bright red blood per rectum.  She has had some chills, but has not checked her temperature.  Denies sick contacts or long distance travel.  Has never experienced these types of symptoms previously.  Was seen in the emergency department on 01/02/2019 for evaluation of the same complaints, was discharged home with Zofran, but has continued to have pain that becomes severe at times, has had additional rectal bleeding, and ongoing nausea with nonbloody vomiting.  She denies chest pain, palpitations, cough, or shortness of breath.  No headache, acute change in vision or hearing, or acute focal numbness or weakness  ED Course: Upon arrival to the ED, patient is found to be afebrile, saturating well on room air, bradycardic in the mid 50s, and hypertensive to 200/100.  CT from the earlier ED visit revealed a nonspecific colitis without complication.  Chemistry panel and CBC are unremarkable.  Patient was given 2 L normal saline, fentanyl, Zofran, Rocephin, and Flagyl in the ED.  Blood pressure has improved some, pain is also improved some, but she does continue to complain of pain, does not feel that she will be able to tolerate her oral medications at home, and will be observed for further evaluation and management.  01/05/2019 patient seen in the room patient complains of multiple episodes of diarrhea overnight.  She  reports stools are black in color.  She is crying with abdominal cramps. Assessment & Plan:   Principal Problem:   Acute colitis Active Problems:   Late effects of CVA (cerebrovascular accident)   Chronic diastolic congestive heart failure (HCC)   Sarcoidosis of lung (HCC)   Hypertensive urgency   Colitis, acute  1.  Salmonella colitis with persistent diarrhea-not able to tolerate p.o. intake.  Continue with IV fluids.  Stool culture positive for Salmonella will continue treatment with IV Rocephin.  Patient continues to have severe cramping abdominal pain and multiple episodes of diarrhea. - Presents with N/V and bloody stool  - CT reveals a non-specific colitis without perforation or abscess   -2. Hypertension with hypertensive urgency  - BP as high as 220/110 in ED, asymptomatic and likely secondary to pain and N/V  - Continue lisinopril, continue pain-control, use hydralazine IVP's as needed   3. Chronic diastolic CHF  - Hypovolemic on admission in setting of N/V  - Given 1 liter NS in ED and continued on IVF hydration  - Follow daily wts, continue ACE-i; beta-blocker held for bradycardia   4. Sarcoidosis  - Reports lung involvement, currently quiescent   5. History of CVA  - She stopped her statin d/t cost, will resume and consult with case manager for possible resources  - Hold ASA in light of bloody diarrhea    Severity this patient is high risk for dehydration sepsis and hypokalemia with persistent diarrhea need to monitor her with IV fluids.  Patient is not able to tolerate p.o. intake.   Estimated body mass index is 28.82 kg/m as calculated from the  following:   Height as of this encounter: 5\' 3"  (1.6 m).   Weight as of this encounter: 73.8 kg.  DVT prophylaxis: SCD full code Code Status: Full code Family Communication none Disposition Plan:  Patient pending clinical improvement patient continues to have explosive multiple diarrhea dependent on IV  hydration Consultants:  None Procedures: None Antimicrobials Rocephin Subjective: Complains of several episodes of diarrhea with some blood nausea has been better not on regular diet on clear liquids  Objective: Vitals:   01/04/19 1623 01/04/19 2250 01/05/19 0451 01/05/19 0457  BP: (!) 159/76 (!) 163/79 (!) 158/69   Pulse: (!) 55 (!) 55 (!) 56   Resp: 16 17 18    Temp: 98.4 F (36.9 C) 98.8 F (37.1 C) 98.4 F (36.9 C)   TempSrc:  Oral Oral   SpO2: 99% 99% 98%   Weight:    73.8 kg  Height:        Intake/Output Summary (Last 24 hours) at 01/05/2019 1036 Last data filed at 01/05/2019 0900 Gross per 24 hour  Intake 873.26 ml  Output -  Net 873.26 ml   Filed Weights   01/03/19 2146 01/04/19 0159 01/05/19 0457  Weight: 72.6 kg 72.6 kg 73.8 kg    Examination:  General exam: Appears calm and comfortable  Respiratory system: Clear to auscultation. Respiratory effort normal. Cardiovascular system: S1 & S2 heard, RRR. No JVD, murmurs, rubs, gallops or clicks. No pedal edema. Gastrointestinal system: Abdomen is nondistended, soft and nontender. No organomegaly or masses felt. Normal bowel sounds heard. Central nervous system: Alert and oriented. No focal neurological deficits. Extremities: Symmetric 5 x 5 power. Skin: No rashes, lesions or ulcers Psychiatry: Judgement and insight appear normal. Mood & affect appropriate.     Data Reviewed: I have personally reviewed following labs and imaging studies  CBC: Recent Labs  Lab 01/02/19 1616 01/03/19 2204 01/04/19 0416  WBC 8.4 7.8 7.0  NEUTROABS  --   --  3.5  HGB 14.9 14.0 12.8  HCT 45.8 43.3 39.7  MCV 96.4 96.7 97.1  PLT 282 248 241   Basic Metabolic Panel: Recent Labs  Lab 01/02/19 1616 01/03/19 2204 01/04/19 0416  NA 139 138 139  K 3.9 3.8 3.7  CL 107 107 109  CO2 23 24 24   GLUCOSE 103* 94 106*  BUN 9 7 6   CREATININE 0.77 0.78 0.66  CALCIUM 8.9 8.7* 8.2*   GFR: Estimated Creatinine Clearance: 82.8  mL/min (by C-G formula based on SCr of 0.66 mg/dL). Liver Function Tests: Recent Labs  Lab 01/02/19 1616 01/03/19 2204  AST 16 27  ALT 10 18  ALKPHOS 58 51  BILITOT 0.7 0.8  PROT 7.9 7.2  ALBUMIN 4.1 3.7   Recent Labs  Lab 01/02/19 1616 01/03/19 2204  LIPASE 38 32   No results for input(s): AMMONIA in the last 168 hours. Coagulation Profile: No results for input(s): INR, PROTIME in the last 168 hours. Cardiac Enzymes: No results for input(s): CKTOTAL, CKMB, CKMBINDEX, TROPONINI in the last 168 hours. BNP (last 3 results) No results for input(s): PROBNP in the last 8760 hours. HbA1C: No results for input(s): HGBA1C in the last 72 hours. CBG: No results for input(s): GLUCAP in the last 168 hours. Lipid Profile: No results for input(s): CHOL, HDL, LDLCALC, TRIG, CHOLHDL, LDLDIRECT in the last 72 hours. Thyroid Function Tests: No results for input(s): TSH, T4TOTAL, FREET4, T3FREE, THYROIDAB in the last 72 hours. Anemia Panel: No results for input(s): VITAMINB12, FOLATE, FERRITIN, TIBC, IRON,  RETICCTPCT in the last 72 hours. Sepsis Labs: No results for input(s): PROCALCITON, LATICACIDVEN in the last 168 hours.  Recent Results (from the past 240 hour(s))  Gastrointestinal Panel by PCR , Stool     Status: Abnormal   Collection Time: 01/04/19 12:57 AM  Result Value Ref Range Status   Campylobacter species NOT DETECTED NOT DETECTED Final   Plesimonas shigelloides NOT DETECTED NOT DETECTED Final   Salmonella species DETECTED (A) NOT DETECTED Final    Comment: RESULT CALLED TO, READ BACK BY AND VERIFIED WITH: SHAINA SHELTON AT 1504 ON 01/04/2019 JJB    Yersinia enterocolitica NOT DETECTED NOT DETECTED Final   Vibrio species NOT DETECTED NOT DETECTED Final   Vibrio cholerae NOT DETECTED NOT DETECTED Final   Enteroaggregative E coli (EAEC) NOT DETECTED NOT DETECTED Final   Enteropathogenic E coli (EPEC) NOT DETECTED NOT DETECTED Final   Enterotoxigenic E coli (ETEC) NOT  DETECTED NOT DETECTED Final   Shiga like toxin producing E coli (STEC) NOT DETECTED NOT DETECTED Final   Shigella/Enteroinvasive E coli (EIEC) NOT DETECTED NOT DETECTED Final   Cryptosporidium NOT DETECTED NOT DETECTED Final   Cyclospora cayetanensis NOT DETECTED NOT DETECTED Final   Entamoeba histolytica NOT DETECTED NOT DETECTED Final   Giardia lamblia NOT DETECTED NOT DETECTED Final   Adenovirus F40/41 NOT DETECTED NOT DETECTED Final   Astrovirus NOT DETECTED NOT DETECTED Final   Norovirus GI/GII NOT DETECTED NOT DETECTED Final   Rotavirus A NOT DETECTED NOT DETECTED Final   Sapovirus (I, II, IV, and V) NOT DETECTED NOT DETECTED Final    Comment: Performed at [facility], [address] [place], [place] [address]         Radiology Studies: No results found.      Scheduled Meds: . dicyclomine  20 mg Oral BID  . hydrALAZINE  20 mg Intravenous Once  . Influenza vac split quadrivalent PF  0.5 mL Intramuscular Tomorrow-1000  . lisinopril  20 mg Oral Daily  . pneumococcal 23 valent vaccine  0.5 mL Intramuscular Tomorrow-1000  . pravastatin  10 mg Oral q1800   Continuous Infusions: . sodium chloride 10 mL/hr at 01/04/19 0908  . cefTRIAXone (ROCEPHIN)  IV 2 g (01/04/19 2153)  . metronidazole 500 mg (01/05/19 0810)     LOS: 0 days     Alwyn Ren, MD [facility]  If 7PM-7AM, please contact night-coverage [email or web address] Password [facility] 01/05/2019, 10:36 AM

## 2019-01-05 NOTE — Care Management Obs Status (Signed)
MEDICARE OBSERVATION STATUS NOTIFICATION   Patient Details  Name: [name] MRN: [id number] Date of Birth: 01/10/1971   Medicare Observation Status Notification Given:  Yes    Bartholome Bill, RN 01/05/2019, 1:47 PM

## 2019-01-06 LAB — BASIC METABOLIC PANEL
ANION GAP: 6 (ref 5–15)
BUN: <5 mg/dL — ABNORMAL LOW (ref 6–20)
CO2: 25 mmol/L (ref 22–32)
Calcium: 8.5 mg/dL — ABNORMAL LOW (ref 8.9–10.3)
Chloride: 109 mmol/L (ref 98–111)
Creatinine, Ser: 0.74 mg/dL (ref 0.44–1.00)
GFR calc Af Amer: >60 mL/min (ref >60)
GFR calc non Af Amer: >60 mL/min (ref >60)
Glucose, Bld: 99 mg/dL (ref 70–99)
Potassium: 3.3 mmol/L — ABNORMAL LOW (ref 3.5–5.1)
Sodium: 140 mmol/L (ref 135–145)

## 2019-01-06 LAB — CBC
HCT: 40.8 % (ref 36.0–46.0)
Hemoglobin: 13.3 g/dL (ref 12.0–15.0)
MCH: 31.1 pg (ref 26.0–34.0)
MCHC: 32.6 g/dL (ref 30.0–36.0)
MCV: 95.6 fL (ref 80.0–100.0)
NRBC: 0 % (ref 0.0–0.2)
Platelets: 290 10*3/uL (ref 150–400)
RBC: 4.27 MIL/uL (ref 3.87–5.11)
RDW: 11.2 % — ABNORMAL LOW (ref 11.5–15.5)
WBC: 6.8 10*3/uL (ref 4.0–10.5)

## 2019-01-06 MED ORDER — POTASSIUM CHLORIDE 10 MEQ/100ML IV SOLN
10.0000 meq | INTRAVENOUS | Status: AC
  Administered 2019-01-06 (×3): 10 meq via INTRAVENOUS
  Filled 2019-01-06 (×2): qty 100

## 2019-01-06 MED ORDER — MAGNESIUM SULFATE 2 GM/50ML IV SOLN
2.0000 g | Freq: Once | INTRAVENOUS | Status: AC
  Administered 2019-01-06: 2 g via INTRAVENOUS
  Filled 2019-01-06: qty 50

## 2019-01-06 MED ORDER — METOPROLOL SUCCINATE ER 25 MG PO TB24
12.5000 mg | ORAL_TABLET | Freq: Every day | ORAL | Status: DC
  Administered 2019-01-06 – 2019-01-07 (×2): 12.5 mg via ORAL
  Filled 2019-01-06 (×2): qty 1

## 2019-01-06 NOTE — Progress Notes (Signed)
BP is very concerning to me given patient has had a prior stroke before and had to have rehab to gain mobility on her right side. BP has stayed elevated during her hospital stay- last night patient had a reading of 194/94-hydralazine was given and doctor was notified - at recheck, BP was still 197/90 - gave another dose of hydralazine. We went down to 162/85 last night then this morning we are at 152/83-patient thinks she needs BP medication changed - she will be speaking to doctor about it today

## 2019-01-06 NOTE — Progress Notes (Signed)
PROGRESS NOTE    [name]  EXH:[id number] DOB: 13-Nov-1971 DOA: 01/03/2019 PCP: Kirt Boys, DO   Brief Narrative:  48 y.o.femalewith medical history significant forchronic diastolic CHF, sarcoidosis with pulmonary involvement, hypertension, history of CVA, and depression, now presenting to the emergency department for evaluation of lower abdominal pain, nausea, vomiting, and rectal bleeding. Patient reports that she developed cramping pain in the lower abdomen on 01/01/2019, waxing and waning, severe at times, and associated with nausea with nonbloody vomiting and multiple episodes of bright red blood per rectum. She has had some chills, but has not checked her temperature. Denies sick contacts or long distance travel. Has never experienced these types of symptoms previously. Was seen in the emergency department on 1/4/2020for evaluation of the same complaints, was discharged home with Zofran, but has continued to have pain that becomes severe at times, has had additional rectal bleeding, and ongoing nausea with nonbloody vomiting. She denies chest pain, palpitations, cough, or shortness of breath. No headache, acute change in vision or hearing, or acute focal numbness or weakness  ED Course:Upon arrival to the ED, patient is found to be afebrile, saturating well on room air, bradycardic in the mid 50s, and hypertensive to 200/100. CT from the earlier ED visit revealed a nonspecific colitis without complication. Chemistry panel and CBC are unremarkable. Patient was given 2 L normal saline, fentanyl, Zofran, Rocephin, and Flagyl in the ED. Blood pressure has improved some, pain is also improved some, but she does continue to complain of pain, does not feel that she will be able to tolerate her oral medications at home, and will be observed for further evaluation and management  Assessment & Plan:   Principal Problem:   Acute colitis Active Problems:   Late effects of CVA  (cerebrovascular accident)   Chronic diastolic congestive heart failure (HCC)   Sarcoidosis of lung (HCC)   Hypertensive urgency   Colitis, acute   1.  Infectious colitis secondary to  Salmonella  with persistent diarrhea-not able to tolerate p.o. intake.  Continue with IV fluids.  Stool culture positive for Salmonella will continue treatment with IV Rocephin.  Patient continues to have severe cramping abdominal pain and multiple episodes of diarrhea. - Presents with N/V and bloody stool  - CT reveals a non-specific colitis without perforation or abscess   -2. Hypertension with hypertensive urgency  - BP as high as 220/110 in ED, asymptomatic and likely secondary to pain and N/V  - Continue lisinopril, continue pain-control, use hydralazine IVP's as needed  -Restart beta-blocker patient was bradycardic upon admission so beta-blocker was on hold now that bradycardia has been resolved restart beta-blocker.  3. Chronic diastolic CHF -stable monitor closely with IV hydration.  4. Sarcoidosis  - Reports lung involvement, currently quiescent   5. History of CVA  - She stopped her statin d/t cost, will resume and consult with case manager for possible resources  - Hold ASA in light of bloody diarrhea   6 hypokalemia replete   Estimated body mass index is 28.82 kg/m as calculated from the following:   Height as of this encounter: 5\' 3"  (1.6 m).   Weight as of this encounter: 73.8 kg.  DVT prophylaxis: SCD Code Status: Full code Family Communication: None Disposition Plan: Patient continues multiple episodes of diarrhea and nausea and vomiting not ready for discharge yet still dependent on IV fluids. Consultants: None  Procedures: None Antimicrobials: Rocephin Subjective: Patient complains of several episodes of diarrhea stool with vomiting once overnight.  Complains of abdominal cramps and headaches.   Objective: Vitals:   01/05/19 2313 01/06/19 0039 01/06/19 0512 01/06/19  0746  BP: (!) 197/90 (!) 162/85 (!) 152/83 (!) 164/79  Pulse:   72 (!) 58  Resp:   14   Temp:   99.1 F (37.3 C)   TempSrc:   Oral   SpO2:   98%   Weight:      Height:        Intake/Output Summary (Last 24 hours) at 01/06/2019 1008 Last data filed at 01/06/2019 0952 Gross per 24 hour  Intake 1885.54 ml  Output -  Net 1885.54 ml   Filed Weights   01/03/19 2146 01/04/19 0159 01/05/19 0457  Weight: 72.6 kg 72.6 kg 73.8 kg    Examination:  General exam: Appears calm and comfortable  Respiratory system: Clear to auscultation. Respiratory effort normal. Cardiovascular system: S1 & S2 heard, RRR. No JVD, murmurs, rubs, gallops or clicks. No pedal edema. Gastrointestinal system: Abdomen is nondistended, soft and tender. No organomegaly or masses felt. Normal bowel sounds heard. Central nervous system: Alert and oriented. No focal neurological deficits. Extremities: Symmetric 5 x 5 power. Skin: No rashes, lesions or ulcers Psychiatry: Judgement and insight appear normal. Mood & affect appropriate.     Data Reviewed: I have personally reviewed following labs and imaging studies  CBC: Recent Labs  Lab 01/02/19 1616 01/03/19 2204 01/04/19 0416 01/05/19 1511 01/06/19 0338  WBC 8.4 7.8 7.0 5.9 6.8  NEUTROABS  --   --  3.5  --   --   HGB 14.9 14.0 12.8 12.5 13.3  HCT 45.8 43.3 39.7 39.2 40.8  MCV 96.4 96.7 97.1 96.6 95.6  PLT 282 248 241 232 290   Basic Metabolic Panel: Recent Labs  Lab 01/02/19 1616 01/03/19 2204 01/04/19 0416 01/05/19 1511 01/06/19 0338  NA 139 138 139 138 140  K 3.9 3.8 3.7 3.2* 3.3*  CL 107 107 109 105 109  CO2 23 24 24 26 25   GLUCOSE 103* 94 106* 108* 99  BUN 9 7 6  <5* <5*  CREATININE 0.77 0.78 0.66 0.76 0.74  CALCIUM 8.9 8.7* 8.2* 8.5* 8.5*   GFR: Estimated Creatinine Clearance: 82.8 mL/min (by C-G formula based on SCr of 0.74 mg/dL). Liver Function Tests: Recent Labs  Lab 01/02/19 1616 01/03/19 2204  AST 16 27  ALT 10 18  ALKPHOS 58  51  BILITOT 0.7 0.8  PROT 7.9 7.2  ALBUMIN 4.1 3.7   Recent Labs  Lab 01/02/19 1616 01/03/19 2204  LIPASE 38 32   No results for input(s): AMMONIA in the last 168 hours. Coagulation Profile: No results for input(s): INR, PROTIME in the last 168 hours. Cardiac Enzymes: No results for input(s): CKTOTAL, CKMB, CKMBINDEX, TROPONINI in the last 168 hours. BNP (last 3 results) No results for input(s): PROBNP in the last 8760 hours. HbA1C: No results for input(s): HGBA1C in the last 72 hours. CBG: No results for input(s): GLUCAP in the last 168 hours. Lipid Profile: No results for input(s): CHOL, HDL, LDLCALC, TRIG, CHOLHDL, LDLDIRECT in the last 72 hours. Thyroid Function Tests: No results for input(s): TSH, T4TOTAL, FREET4, T3FREE, THYROIDAB in the last 72 hours. Anemia Panel: No results for input(s): VITAMINB12, FOLATE, FERRITIN, TIBC, IRON, RETICCTPCT in the last 72 hours. Sepsis Labs: No results for input(s): PROCALCITON, LATICACIDVEN in the last 168 hours.  Recent Results (from the past 240 hour(s))  Gastrointestinal Panel by PCR , Stool     Status: Abnormal  Collection Time: 01/04/19 12:57 AM  Result Value Ref Range Status   Campylobacter species NOT DETECTED NOT DETECTED Final   Plesimonas shigelloides NOT DETECTED NOT DETECTED Final   Salmonella species DETECTED (A) NOT DETECTED Final    Comment: RESULT CALLED TO, READ BACK BY AND VERIFIED WITH: SHAINA SHELTON AT 1504 ON 01/04/2019 JJB    Yersinia enterocolitica NOT DETECTED NOT DETECTED Final   Vibrio species NOT DETECTED NOT DETECTED Final   Vibrio cholerae NOT DETECTED NOT DETECTED Final   Enteroaggregative E coli (EAEC) NOT DETECTED NOT DETECTED Final   Enteropathogenic E coli (EPEC) NOT DETECTED NOT DETECTED Final   Enterotoxigenic E coli (ETEC) NOT DETECTED NOT DETECTED Final   Shiga like toxin producing E coli (STEC) NOT DETECTED NOT DETECTED Final   Shigella/Enteroinvasive E coli (EIEC) NOT DETECTED NOT  DETECTED Final   Cryptosporidium NOT DETECTED NOT DETECTED Final   Cyclospora cayetanensis NOT DETECTED NOT DETECTED Final   Entamoeba histolytica NOT DETECTED NOT DETECTED Final   Giardia lamblia NOT DETECTED NOT DETECTED Final   Adenovirus F40/41 NOT DETECTED NOT DETECTED Final   Astrovirus NOT DETECTED NOT DETECTED Final   Norovirus GI/GII NOT DETECTED NOT DETECTED Final   Rotavirus A NOT DETECTED NOT DETECTED Final   Sapovirus (I, II, IV, and V) NOT DETECTED NOT DETECTED Final    Comment: Performed at [facility], [address] [place], [place] [address]         Radiology Studies: No results found.      Scheduled Meds: . dicyclomine  20 mg Oral BID  . hydrALAZINE  20 mg Intravenous Once  . Influenza vac split quadrivalent PF  0.5 mL Intramuscular Tomorrow-1000  . lisinopril  20 mg Oral Daily  . metoprolol succinate  12.5 mg Oral Daily  . pneumococcal 23 valent vaccine  0.5 mL Intramuscular Tomorrow-1000  . pravastatin  10 mg Oral q1800   Continuous Infusions: . sodium chloride 10 mL/hr at 01/06/19 0359  . cefTRIAXone (ROCEPHIN)  IV Stopped (01/05/19 2141)  . metronidazole 500 mg (01/06/19 0753)     LOS: 1 day        Alwyn RenElizabeth G Keyron Pokorski, MD [facility] If 7PM-7AM, please contact night-coverage [email or web address] Password [email or web address] 01/06/2019, 10:08 AM

## 2019-01-07 LAB — BASIC METABOLIC PANEL
Anion gap: 6 (ref 5–15)
BUN: <5 mg/dL — ABNORMAL LOW (ref 6–20)
CO2: 25 mmol/L (ref 22–32)
Calcium: 8.4 mg/dL — ABNORMAL LOW (ref 8.9–10.3)
Chloride: 108 mmol/L (ref 98–111)
Creatinine, Ser: 0.76 mg/dL (ref 0.44–1.00)
GFR calc non Af Amer: >60 mL/min (ref >60)
Glucose, Bld: 90 mg/dL (ref 70–99)
Potassium: 3.7 mmol/L (ref 3.5–5.1)
Sodium: 139 mmol/L (ref 135–145)

## 2019-01-07 MED ORDER — METOPROLOL SUCCINATE ER 25 MG PO TB24: 12.5000 mg | ORAL_TABLET | Freq: Every day | ORAL | 1 refills | Status: AC

## 2019-01-07 MED ORDER — SACCHAROMYCES BOULARDII 250 MG PO CAPS: 250.0000 mg | ORAL_CAPSULE | Freq: Two times a day (BID) | ORAL | 0 refills | Status: AC

## 2019-01-07 MED ORDER — SACCHAROMYCES BOULARDII 250 MG PO CAPS
250.0000 mg | ORAL_CAPSULE | Freq: Two times a day (BID) | ORAL | Status: DC
  Administered 2019-01-07: 250 mg via ORAL
  Filled 2019-01-07: qty 1

## 2019-01-07 MED ORDER — PRAVASTATIN SODIUM 10 MG PO TABS: 10.0000 mg | ORAL_TABLET | Freq: Every day | ORAL | 1 refills | Status: AC

## 2019-01-07 NOTE — Progress Notes (Signed)
MD notified because patient wants to receive 1600 dose of antibiotic prior to discharge and B/P is elevated 175/92. MD instructed nurse that it was ok to give 1600 dose of antibiotics and to give a dose of hydralazine prior to discharging patient. Patient has been made aware.

## 2019-01-07 NOTE — Discharge Summary (Signed)
Physician Discharge Summary  [name] WUJ:[id number] DOB: 1971/03/09 DOA: 01/03/2019  PCP: Kirt Boys, DO  Admit date: 01/03/2019 Discharge date: 01/07/2019  Admitted From: Home Disposition: Home Recommendations for Outpatient Follow-up:  1. Follow up with PCP in 1-2 weeks 2. Please obtain BMP/CBC in one week  Home Health none Equipment/Devices: None  Discharge Condition: Stable CODE STATUS full code Diet recommendation: Cardiac Brief/Interim Summary:48 y.o.femalewith medical history significant forchronic diastolic CHF, sarcoidosis with pulmonary involvement, hypertension, history of CVA, and depression, now presenting to the emergency department for evaluation of lower abdominal pain, nausea, vomiting, and rectal bleeding. Patient reports that she developed cramping pain in the lower abdomen on 01/01/2019, waxing and waning, severe at times, and associated with nausea with nonbloody vomiting and multiple episodes of bright red blood per rectum. She has had some chills, but has not checked her temperature. Denies sick contacts or long distance travel. Has never experienced these types of symptoms previously. Was seen in the emergency department on 1/4/2020for evaluation of the same complaints, was discharged home with Zofran, but has continued to have pain that becomes severe at times, has had additional rectal bleeding, and ongoing nausea with nonbloody vomiting. She denies chest pain, palpitations, cough, or shortness of breath. No headache, acute change in vision or hearing, or acute focal numbness or weakness  ED Course:Upon arrival to the ED, patient is found to be afebrile, saturating well on room air, bradycardic in the mid 50s, and hypertensive to 200/100. CT from the earlier ED visit revealed a nonspecific colitis without complication. Chemistry panel and CBC are unremarkable. Patient was given 2 L normal saline, fentanyl, Zofran, Rocephin, and Flagyl in the ED. Blood  pressure has improved some, pain is also improved some, but she does continue to complain of pain, does not feel that she will be able to tolerate her oral medications at home, and will be observed for further evaluation and management   Discharge Diagnoses:  Principal Problem:   Acute colitis Active Problems:   Late effects of CVA (cerebrovascular accident)   Chronic diastolic congestive heart failure (HCC)   Sarcoidosis of lung (HCC)   Hypertensive urgency   Colitis, acute     1.  Infectious colitis secondary toSalmonella -patient reports not having a bowel movement since last night she was able to tolerate p.o. intake we will discharge her today on ciprofloxacin for 7 days. - CT reveals a non-specific colitis without perforation or abscess   -2. Hypertension with hypertensive urgency  - BP as high as 220/110 in ED, asymptomatic and likely secondary to pain and N/V  - Continue lisinopril-Restart beta-blocker at a lower dose.  3. Chronic diastolic CHF -stable  4. Sarcoidosis  - Reports lung involvement, currently quiescent   5. History of CVA  -Continue statin and restart aspirin  6 hypokalemia repleted potassium 3.7 upon discharge.  Estimated body mass index is 28.98 kg/m as calculated from the following:   Height as of this encounter: 5\' 3"  (1.6 m).   Weight as of this encounter: 74.2 kg.  Discharge Instructions  Discharge Instructions    Call MD for:  persistant dizziness or light-headedness   Complete by:  As directed    Call MD for:  persistant nausea and vomiting   Complete by:  As directed    Call MD for:  temperature >100.4   Complete by:  As directed    Diet - low sodium heart healthy   Complete by:  As directed    Increase  activity slowly   Complete by:  As directed      Allergies as of 01/07/2019      Reactions   Quetiapine Fumarate Anaphylaxis   Risperdal [risperidone] Anaphylaxis   Naproxen Rash, Other (See Comments)   Unknown reaction to  patient   Risperidone And Related Other (See Comments)   Breast leakage   Seroquel [quetiapine Fumarate] Other (See Comments)   Caused breast leakage      Medication List    STOP taking these medications   clopidogrel 75 MG tablet Commonly known as:  PLAVIX   dexlansoprazole 60 MG capsule Commonly known as:  DEXILANT   lubiprostone 8 MCG capsule Commonly known as:  AMITIZA   ondansetron 4 MG disintegrating tablet Commonly known as:  ZOFRAN ODT   ranitidine 150 MG tablet Commonly known as:  ZANTAC     TAKE these medications   albuterol 108 (90 Base) MCG/ACT inhaler Commonly known as:  PROVENTIL HFA;VENTOLIN HFA Inhale 2 puffs into the lungs every 6 (six) hours as needed for wheezing or shortness of breath.   aspirin 81 MG tablet Take 1 tablet (81 mg total) by mouth daily.   citalopram 20 MG tablet Commonly known as:  CELEXA Take 1 tablet (20 mg total) by mouth daily.   dicyclomine 20 MG tablet Commonly known as:  BENTYL Take 1 tablet (20 mg total) by mouth 2 (two) times daily.   lisinopril 20 MG tablet Commonly known as:  PRINIVIL,ZESTRIL Take one tablet by mouth once daily for blood pressure   metoprolol succinate 25 MG 24 hr tablet Commonly known as:  TOPROL-XL Take 0.5 tablets (12.5 mg total) by mouth daily. Start taking on:  January 08, 2019   pravastatin 10 MG tablet Commonly known as:  PRAVACHOL Take 1 tablet (10 mg total) by mouth daily at 6 PM. What changed:  when to take this   saccharomyces boulardii 250 MG capsule Commonly known as:  FLORASTOR Take 1 capsule (250 mg total) by mouth 2 (two) times daily.      Follow-up Information    Carter, Monica, DO Follow up.   Specialty:  Internal Medicine Contact information: [address] [place] [address] [phone]          Allergies  Allergen Reactions  . Quetiapine Fumarate Anaphylaxis  . Risperdal [Risperidone] Anaphylaxis  . Naproxen Rash and Other (See Comments)    Unknown  reaction to patient  . Risperidone And Related Other (See Comments)    Breast leakage  . Seroquel [Quetiapine Fumarate] Other (See Comments)    Caused breast leakage    Consultations: None  Procedures/Studies: Ct Abdomen Pelvis W Contrast  Result Date: 01/02/2019 CLINICAL DATA:  Severe abdominal pain, nausea, vomiting and diarrhea. History of cholecystectomy, sarcoidosis and constipation. EXAM: CT ABDOMEN AND PELVIS WITH CONTRAST TECHNIQUE: Multidetector CT imaging of the abdomen and pelvis was performed using the standard protocol following bolus administration of intravenous contrast. CONTRAST:  <MEASUREMEMarland Philli<MEASUREJacki CPhysicians [id number] [place] Cou<MEASUREMENTMarland KiJTaPhilli<MEASUREJacki CMohawk Do7780 [address]ner<MEASUREMENTMarland KPhilli<MEASUREJDoss9480 Eas8196 [address]<MEASUREMENTMarland KiJHigPhilli<MEASUREJacDoss671 [address] <MEASUREMENTMarlandPhilli<MEASUREJacki CAnneDoss831655 [place] L<MEASUREMENTMarland KiPhilli<MEASUREJac9067 Ridgewo<MEASUREMENTMarland KPhilli<MEASUREJackiDoss[address]Corw7<MEASUREMENTMarland Philli<MEASURDoss945 [address]<MEASUREMENTMarland KPhilli<MEASUREJacki COscar G. Dos9774 Sage <MEASUREMENTMarland KPhilli<MEASUREJacki CR[facility] & RDoss152 Mano[address] Water<MEASUREMENTMarland KiJPhilli<MEASUREJaDoss9461 [address] Pil[address]<MEASUREMENTMarland KiJPhilli<MEASUREJacki CEsDoss529[address]<MEASUREMENTMarland KiPhilli<MEASUREDos[address]Cor<MEASUREMENTMarland KiJRio CoPhilli<MEASUREJDos[address]Cor<MEASUREMENTMarland KiJOrPhilli<MEASUREJacki CDoss5049 Georg<MEASUREMENTMarland KiJLPhilli<MEASUREJacki CEncompass Health RehabDoss392 [address]<MEASUREMENTMarland KiJMiPhilli<MEASUREJackDoss59201 Paci<MEASUREMENTMarland KiJPoPhilli<MEASUREJDoss88[address]Corw958<MEASUREMENTMarland KPhilli<MEASUREJacki [address] [place] [address]<MEASUREMENTMarland KPhilli<MEADo117 [place] <MEASUREMENTMarland KiJSeaPhilli<MEASUREJacki C3 [address] <MEASUREMENTMarland KPhilli<MEASUREJacki CLa PDoss7688[address]Cor<MEASUREMENTMarlanPhilli<MEASUREJacki CTy [facility] Doss9767 [address]<MEASUREMENTMarland KiJPhilli<MEASUREDoss618 [address]Corw[address]cal scientist INJECTION 61% COMPARISON:  None. FINDINGS: LOWER CHEST: Bibasilar scarring and linear calcifications versus surgical suture material in lingula. No pleural effusion or focal consolidation. Included heart size is normal. HEPATOBILIARY: Status post cholecystectomy.  Normal liver. PANCREAS: Normal. SPLEEN: Normal. ADRENALS/URINARY TRACT: Kidneys are orthotopic, demonstrating symmetric enhancement. No nephrolithiasis, hydronephrosis or solid renal masses. Too small to characterize RIGHT interpolar hypodensity. The unopacified ureters are normal in course and caliber. Delayed imaging through the kidneys demonstrates symmetric prompt contrast excretion within the proximal urinary collecting system. Urinary bladder  is partially distended and unremarkable. Normal adrenal glands. STOMACH/BOWEL: Colonic wall thickening and edema from cecum through descending colon with pericolonic fat stranding. Mild descending and sigmoid colonic diverticulosis. Small amount of small bowel feces compatible with chronic stasis. The stomach, small bowel are normal in course and caliber without inflammatory changes. Mild proximal appendix mural enhancement, likely secondary to adjacent colitis as remainder of appendix is normal. VASCULAR/LYMPHATIC: Aortoiliac vessels are normal in course and caliber.  No lymphadenopathy by CT size criteria. REPRODUCTIVE: Normal. OTHER: Minimal free fluid in the pelvis. No focal fluid collection or intraperitoneal free air. MUSCULOSKELETAL: Nonacute.  Small L4-5 and L5-S1 disc bulges. IMPRESSION: 1. Nonspecific colitis without complication. Electronically Signed   By: Awilda Metro M.D.   On: 01/02/2019 18:34    (Echo, Carotid, EGD, Colonoscopy, ERCP)    Subjective: Patient patient sitting up in bed this the first time I see receive her sitting up awake alert anxious to start eating and go home.   Discharge Exam: Vitals:   01/06/19 2318 01/07/19 0535  BP: (!) 142/86 136/81  Pulse: 61 63  Resp: 18 16  Temp: 98.4 F (36.9 C) 98.6 F (37 C)  SpO2: 100% 100%   Vitals:   01/06/19 1129 01/06/19 1353 01/06/19 2318 01/07/19 0535  BP: (!) 183/99 132/75 (!) 142/86 136/81  Pulse: 62 65 61 63  Resp:  17 18 16   Temp:  98.6 F (37 C) 98.4 F (36.9 C) 98.6 F (37 C)  TempSrc:  Oral Oral Oral  SpO2:  99% 100% 100%  Weight:    74.2 kg  Height:        General: Pt is alert, awake, not in acute distress Cardiovascular: RRR, S1/S2 +, no rubs, no gallops Respiratory: CTA bilaterally, no wheezing, no rhonchi Abdominal: Soft, NT, ND, bowel sounds + Extremities: no edema, no cyanosis    The results of significant diagnostics from this hospitalization (including imaging, microbiology, ancillary and laboratory) are listed below for reference.     Microbiology: Recent Results (from the past 240 hour(s))  Gastrointestinal Panel by PCR , Stool     Status: Abnormal   Collection Time: 01/04/19 12:57 AM  Result Value Ref Range Status   Campylobacter species NOT DETECTED NOT DETECTED Final   Plesimonas shigelloides NOT DETECTED NOT DETECTED Final   Salmonella species DETECTED (A) NOT DETECTED Final    Comment: RESULT CALLED TO, READ BACK BY AND VERIFIED WITH: SHAINA SHELTON AT 1504 ON 01/04/2019 JJB    Yersinia enterocolitica NOT DETECTED NOT DETECTED  Final   Vibrio species NOT DETECTED NOT DETECTED Final   Vibrio cholerae NOT DETECTED NOT DETECTED Final   Enteroaggregative E coli (EAEC) NOT DETECTED NOT DETECTED Final   Enteropathogenic E coli (EPEC) NOT DETECTED NOT DETECTED Final   Enterotoxigenic E coli (ETEC) NOT DETECTED NOT DETECTED Final   Shiga like toxin producing E coli (STEC) NOT DETECTED NOT DETECTED Final   Shigella/Enteroinvasive E coli (EIEC) NOT DETECTED NOT DETECTED Final   Cryptosporidium NOT DETECTED NOT DETECTED Final   Cyclospora cayetanensis NOT DETECTED NOT DETECTED Final   Entamoeba histolytica NOT DETECTED NOT DETECTED Final   Giardia lamblia NOT DETECTED NOT DETECTED Final   Adenovirus F40/41 NOT DETECTED NOT DETECTED Final   Astrovirus NOT DETECTED NOT DETECTED Final   Norovirus GI/GII NOT DETECTED NOT DETECTED Final   Rotavirus A NOT DETECTED NOT DETECTED Final   Sapovirus (I, II, IV, and V) NOT DETECTED NOT DETECTED Final    Comment: Performed at  [facility], [address] [place], [place] [id number]     Labs: BNP (last 3 results) No results for input(s): BNP in the last 8760 hours. Basic Metabolic Panel: Recent Labs  Lab 01/03/19 2204 01/04/19 0416 01/05/19 1511 01/06/19 0338 01/07/19 0658  NA 138 139 138 140 139  K 3.8 3.7 3.2* 3.3* 3.7  CL 107 109 105 109 108  CO2 24 24 26 25 25   GLUCOSE 94 106* 108* 99 90  BUN 7 6 <5* <5* <5*  CREATININE 0.78 0.66 0.76 0.74 0.76  CALCIUM 8.7* 8.2* 8.5* 8.5* 8.4*   Liver Function Tests: Recent Labs  Lab 01/02/19 1616 01/03/19 2204  AST 16 27  ALT 10 18  ALKPHOS 58 51  BILITOT 0.7 0.8  PROT 7.9 7.2  ALBUMIN 4.1 3.7   Recent Labs  Lab 01/02/19 1616 01/03/19 2204  LIPASE 38 32   No results for input(s): AMMONIA in the last 168 hours. CBC: Recent Labs  Lab 01/02/19 1616 01/03/19 2204 01/04/19 0416 01/05/19 1511 01/06/19 0338  WBC 8.4 7.8 7.0 5.9 6.8  NEUTROABS  --   --  3.5  --   --   HGB 14.9 14.0 12.8 12.5 13.3  HCT  45.8 43.3 39.7 39.2 40.8  MCV 96.4 96.7 97.1 96.6 95.6  PLT 282 248 241 232 290   Cardiac Enzymes: No results for input(s): CKTOTAL, CKMB, CKMBINDEX, TROPONINI in the last 168 hours. BNP: Invalid input(s): POCBNP CBG: No results for input(s): GLUCAP in the last 168 hours. D-Dimer No results for input(s): DDIMER in the last 72 hours. Hgb A1c No results for input(s): HGBA1C in the last 72 hours. Lipid Profile No results for input(s): CHOL, HDL, LDLCALC, TRIG, CHOLHDL, LDLDIRECT in the last 72 hours. Thyroid function studies No results for input(s): TSH, T4TOTAL, T3FREE, THYROIDAB in the last 72 hours.  Invalid input(s): FREET3 Anemia work up No results for input(s): VITAMINB12, FOLATE, FERRITIN, TIBC, IRON, RETICCTPCT in the last 72 hours. Urinalysis    Component Value Date/Time   COLORURINE YELLOW 01/04/2019 0300   APPEARANCEUR HAZY (A) 01/04/2019 0300   LABSPEC 1.026 01/04/2019 0300   PHURINE 5.0 01/04/2019 0300   GLUCOSEU NEGATIVE 01/04/2019 0300   HGBUR SMALL (A) 01/04/2019 0300   BILIRUBINUR NEGATIVE 01/04/2019 0300   KETONESUR NEGATIVE 01/04/2019 0300   PROTEINUR NEGATIVE 01/04/2019 0300   UROBILINOGEN 0.2 04/12/2015 1825   NITRITE NEGATIVE 01/04/2019 0300   LEUKOCYTESUR NEGATIVE 01/04/2019 0300   Sepsis Labs Invalid input(s): PROCALCITONIN,  WBC,  LACTICIDVEN Microbiology Recent Results (from the past 240 hour(s))  Gastrointestinal Panel by PCR , Stool     Status: Abnormal   Collection Time: 01/04/19 12:57 AM  Result Value Ref Range Status   Campylobacter species NOT DETECTED NOT DETECTED Final   Plesimonas shigelloides NOT DETECTED NOT DETECTED Final   Salmonella species DETECTED (A) NOT DETECTED Final    Comment: RESULT CALLED TO, READ BACK BY AND VERIFIED WITH: SHAINA SHELTON AT 1504 ON 01/04/2019 JJB    Yersinia enterocolitica NOT DETECTED NOT DETECTED Final   Vibrio species NOT DETECTED NOT DETECTED Final   Vibrio cholerae NOT DETECTED NOT DETECTED Final    Enteroaggregative E coli (EAEC) NOT DETECTED NOT DETECTED Final   Enteropathogenic E coli (EPEC) NOT DETECTED NOT DETECTED Final   Enterotoxigenic E coli (ETEC) NOT DETECTED NOT DETECTED Final   Shiga like toxin producing E coli (STEC) NOT DETECTED NOT DETECTED Final   Shigella/Enteroinvasive E coli (EIEC) NOT DETECTED NOT DETECTED  Final   Cryptosporidium NOT DETECTED NOT DETECTED Final   Cyclospora cayetanensis NOT DETECTED NOT DETECTED Final   Entamoeba histolytica NOT DETECTED NOT DETECTED Final   Giardia lamblia NOT DETECTED NOT DETECTED Final   Adenovirus F40/41 NOT DETECTED NOT DETECTED Final   Astrovirus NOT DETECTED NOT DETECTED Final   Norovirus GI/GII NOT DETECTED NOT DETECTED Final   Rotavirus A NOT DETECTED NOT DETECTED Final   Sapovirus (I, II, IV, and V) NOT DETECTED NOT DETECTED Final    Comment: Performed at [facility], [address], [place], [place] [address]     Time coordinating discharge: 33  minutes  SIGNED:   Alwyn Ren, MD  [facility] 01/07/2019, 11:19 AM Pager   If 7PM-7AM, please contact night-coverage [email or web address] Password [email or web address]

## 2019-01-07 NOTE — Progress Notes (Signed)
Hydralazine given per MD orders. Patient ok to be discharged per MD

## 2019-03-25 ENCOUNTER — Emergency Department (HOSPITAL_COMMUNITY)
Admission: EM | Admit: 2019-03-25 | Discharge: 2019-03-25 | Disposition: A | Discharge status: Home / Self Care | Payer: Medicare Other | Attending: Emergency Medicine | Admitting: Emergency Medicine

## 2019-03-25 ENCOUNTER — Encounter (HOSPITAL_COMMUNITY): Payer: Self-pay

## 2019-03-25 ENCOUNTER — Other Ambulatory Visit: Payer: Self-pay

## 2019-03-25 DIAGNOSIS — I110 Hypertensive heart disease with heart failure: Secondary | ICD-10-CM | POA: Diagnosis not present

## 2019-03-25 DIAGNOSIS — F319 Bipolar disorder, unspecified: Secondary | ICD-10-CM | POA: Insufficient documentation

## 2019-03-25 DIAGNOSIS — M62830 Muscle spasm of back: Secondary | ICD-10-CM | POA: Diagnosis not present

## 2019-03-25 DIAGNOSIS — Z87891 Personal history of nicotine dependence: Secondary | ICD-10-CM | POA: Diagnosis not present

## 2019-03-25 DIAGNOSIS — M545 Low back pain: Secondary | ICD-10-CM | POA: Diagnosis present

## 2019-03-25 DIAGNOSIS — J449 Chronic obstructive pulmonary disease, unspecified: Secondary | ICD-10-CM | POA: Diagnosis not present

## 2019-03-25 DIAGNOSIS — F1420 Cocaine dependence, uncomplicated: Secondary | ICD-10-CM | POA: Insufficient documentation

## 2019-03-25 DIAGNOSIS — I5032 Chronic diastolic (congestive) heart failure: Secondary | ICD-10-CM | POA: Diagnosis not present

## 2019-03-25 MED ORDER — LIDOCAINE 5 % EX PTCH
1.0000 | MEDICATED_PATCH | CUTANEOUS | Status: DC
  Administered 2019-03-25: 1 via TRANSDERMAL
  Filled 2019-03-25: qty 1

## 2019-03-25 MED ORDER — LIDOCAINE 5 % EX PTCH: 1.0000 | MEDICATED_PATCH | CUTANEOUS | 0 refills | Status: AC

## 2019-03-25 MED ORDER — IBUPROFEN 800 MG PO TABS: 800.0000 mg | ORAL_TABLET | Freq: Three times a day (TID) | ORAL | 0 refills | Status: AC | PRN

## 2019-03-25 MED ORDER — KETOROLAC TROMETHAMINE 60 MG/2ML IM SOLN
60.0000 mg | Freq: Once | INTRAMUSCULAR | Status: AC
  Administered 2019-03-25: 60 mg via INTRAMUSCULAR
  Filled 2019-03-25: qty 2

## 2019-03-25 NOTE — ED Triage Notes (Signed)
Pt BIB EMS from home. Pt has back pain that radiates down right leg. EMS reports pt stated that she could not walk but was ambulatory with EMS.

## 2019-03-25 NOTE — ED Provider Notes (Signed)
[facility]-EMERGENCY DEPT Provider Note   CSN: [id number] Arrival date & time: 03/25/19  1515    History   Chief Complaint Chief Complaint  Patient presents with  . Back Pain    HPI [name] is a 48 y.o. female.     The history is provided by the patient.  Back Pain  Location:  Gluteal region Quality:  Shooting Radiates to:  R posterior upper leg Pain severity:  Mild Worse during: waxing and waning  Onset quality:  Gradual Timing:  Intermittent Progression:  Waxing and waning Chronicity:  New Context: lifting heavy objects and twisting   Context: not falling   Relieved by:  Nothing Worsened by:  Nothing Associated symptoms: no abdominal pain, no abdominal swelling, no bladder incontinence, no bowel incontinence, no chest pain, no dysuria, no fever, no headaches, no leg pain, no numbness, no paresthesias, no pelvic pain, no perianal numbness, no tingling, no weakness and no weight loss     Past Medical History:  Diagnosis Date  . Allergic rhinitis due to pollen   . Allergic rhinitis due to pollen   . Bipolar disorder (HCC)   . CHF (congestive heart failure) (HCC)   . Cocaine dependence (HCC) 2012  . Depressive disorder, not elsewhere classified   . Hypertension   . Obesity, unspecified   . Osteoporosis, unspecified   . Other abnormal blood chemistry   . Other and unspecified hyperlipidemia   . Other and unspecified hyperlipidemia   . Other convulsions   . Other malaise and fatigue   . Reflux esophagitis   . Sarcoidosis   . Seizure disorder (HCC)   . Tobacco use disorder    stopped smoking  . Unspecified constipation   . Unspecified hereditary and idiopathic peripheral neuropathy   . Unspecified late effects of cerebrovascular disease   . Unspecified sleep apnea   . Unspecified vitamin D deficiency     Patient Active Problem List   Diagnosis Date Noted  . Hypertensive urgency 01/04/2019  . Acute colitis 01/04/2019  .  Colitis, acute 01/04/2019  . Nausea and vomiting   . History of completed stroke   . Meralgia paresthetica of right side 04/12/2015  . Hyperlipidemia LDL goal <100 12/20/2014  . Chronic diastolic congestive heart failure (HCC) 04/27/2014  . s 04/27/2014  . 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 Encounter for therapeutic drug monitoring 04/27/2014  . COPD (chronic obstructive pulmonary disease) (HCC) 04/05/2014  . CHF (congestive heart failure) (HCC) 04/05/2014  . Muscle weakness of right arm 04/05/2014  . Tinea versicolor 09/08/2013  . Hyperlipidemia with target LDL less than 130 06/08/2013  . Peripheral neuropathy 06/08/2013  . Essential hypertension, benign 06/08/2013  . Late effects of CVA (cerebrovascular accident) 06/08/2013  . GERD (gastroesophageal reflux disease) 06/08/2013  . Constipation 06/08/2013  . Chronic low back pain 06/08/2013  . Insomnia 06/08/2013    Past Surgical History:  Procedure Laterality Date  . CHOLECYSTECTOMY    . FRACTURE SURGERY     right arm  . LUNG BIOPSY       OB History   No obstetric history on file.      Home Medications    Prior to Admission medications   Medication Sig Start Date End Date Taking? Authorizing Provider  albuterol (PROVENTIL HFA;VENTOLIN HFA) 108 (90 BASE) MCG/ACT inhaler Inhale 2 puffs into the lungs every 6 (six) hours as needed for wheezing or shortness of breath. 08/09/14   Oneal Grout, MD  aspirin 81  MG tablet Take 1 tablet (81 mg total) by mouth daily. 04/05/14   Oneal Grout, MD  citalopram (CELEXA) 20 MG tablet Take 1 tablet (20 mg total) by mouth daily. Patient not taking: Reported on 04/12/2015 12/20/14   Oneal Grout, MD  dicyclomine (BENTYL) 20 MG tablet Take 1 tablet (20 mg total) by mouth 2 (two) times daily. 01/02/19   Fawze, Mina A, PA-C  ibuprofen (ADVIL,MOTRIN) 800 MG tablet Take 1 tablet (800 mg total) by mouth every 8 (eight) hours as needed for up to 30 doses. 03/25/19   Heidie Krall, DO   lidocaine (LIDODERM) 5 % Place 1 patch onto the skin daily for 15 doses. Remove & Discard patch within 12 hours or as directed by MD 03/25/19 04/09/19  Virgina Norfolk, DO  lisinopril (PRINIVIL,ZESTRIL) 20 MG tablet Take one tablet by mouth once daily for blood pressure 08/09/14   Oneal Grout, MD  metoprolol succinate (TOPROL-XL) 25 MG 24 hr tablet Take 0.5 tablets (12.5 mg total) by mouth daily. 01/08/19   Alwyn Ren, MD  pravastatin (PRAVACHOL) 10 MG tablet Take 1 tablet (10 mg total) by mouth daily at 6 PM. 01/07/19   Alwyn Ren, MD  saccharomyces boulardii (FLORASTOR) 250 MG capsule Take 1 capsule (250 mg total) by mouth 2 (two) times daily. 01/07/19   Alwyn Ren, MD    Family History Family History  Problem Relation Age of Onset  . Diabetes Mother   . Hypertension Mother   . Hypertension Father   . Stroke Father   . Cancer Paternal Uncle        breast    Social History Social History   Tobacco Use  . Smoking status: Former Smoker    Last attempt to quit: 05/30/2013    Years since quitting: 5.8  . Smokeless tobacco: Never Used  Substance Use Topics  . Alcohol use: No  . Drug use: No     Allergies   Quetiapine fumarate; Risperdal [risperidone]; Naproxen; Risperidone and related; and Seroquel [quetiapine fumarate]   Review of Systems Review of Systems  Constitutional: Negative for chills, fever and weight loss.  HENT: Negative for ear pain and sore throat.   Eyes: Negative for pain and visual disturbance.  Respiratory: Negative for cough and shortness of breath.   Cardiovascular: Negative for chest pain and palpitations.  Gastrointestinal: Negative for abdominal pain, bowel incontinence and vomiting.  Genitourinary: Negative for bladder incontinence, dysuria, hematuria and pelvic pain.  Musculoskeletal: Positive for back pain and gait problem. Negative for arthralgias, joint swelling, myalgias, neck pain and neck stiffness.  Skin: Negative for  color change and rash.  Neurological: Negative for dizziness, tingling, tremors, seizures, syncope, facial asymmetry, speech difficulty, weakness, light-headedness, numbness, headaches and paresthesias.  All other systems reviewed and are negative.    Physical Exam Updated Vital Signs  ED Triage Vitals  Enc Vitals Group     BP 03/25/19 1527 (!) 198/96     Pulse Rate 03/25/19 1527 (!) 56     Resp 03/25/19 1527 (!) 23     Temp 03/25/19 1527 98.3 F (36.8 C)     Temp Source 03/25/19 1527 Oral     SpO2 03/25/19 1527 100 %     Weight --      Height --      Head Circumference --      Peak Flow --      Pain Score 03/25/19 1523 10     Pain Loc --  Pain Edu? --      Excl. in GC? --     Physical Exam Vitals signs and nursing note reviewed.  Constitutional:      General: She is not in acute distress.    Appearance: She is well-developed.  HENT:     Head: Normocephalic and atraumatic.  Eyes:     Conjunctiva/sclera: Conjunctivae normal.  Neck:     Musculoskeletal: Neck supple.  Cardiovascular:     Rate and Rhythm: Normal rate and regular rhythm.     Pulses: Normal pulses.     Heart sounds: Normal heart sounds. No murmur.  Pulmonary:     Effort: Pulmonary effort is normal. No respiratory distress.     Breath sounds: Normal breath sounds.  Abdominal:     Palpations: Abdomen is soft.     Tenderness: There is no abdominal tenderness.  Musculoskeletal: Normal range of motion.        General: Tenderness (TTP to right gluteal area) present.     Comments: No midline spinal tenderness  Skin:    General: Skin is warm and dry.     Capillary Refill: Capillary refill takes less than 2 seconds.  Neurological:     General: No focal deficit present.     Mental Status: She is alert and oriented to person, place, and time.     Cranial Nerves: No cranial nerve deficit.     Sensory: No sensory deficit.     Motor: No weakness.     Coordination: Coordination normal.     Gait: Gait  abnormal (antalgic ).     Comments: 5+ out of 5 strength without, normal sensation, no drift      ED Treatments / Results  Labs (all labs ordered are listed, but only abnormal results are displayed) Labs Reviewed - No data to display  EKG None  Radiology No results found.  Procedures Procedures (including critical care time)  Medications Ordered in ED Medications  ketorolac (TORADOL) injection 60 mg (has no administration in time range)  lidocaine (LIDODERM) 5 % 1 patch (has no administration in time range)     Initial Impression / Assessment and Plan / ED Course  I have reviewed the triage vital signs and the nursing notes.  Pertinent labs & imaging results that were available during my care of the patient were reviewed by me and considered in my medical decision making (see chart for details).     Kaimi Chrestman is a 48 year old female who presents to the ED with low back pain.  Patient with overall unremarkable vitals.  No fever.  Patient with pain in the low right buttocks area.  Is tender to palpation in this area.  She states that she has intermittent radiating pain to her right thigh and knee.  States that it feels as if her muscles have spasms on and off all day.  They get a little bit better after warm bath.  Denies any trauma.  States that she has been lifting some heavy objects.  She is neurologically intact.  She is able to walk with some pain.  Overall patient appears to have sciatica/musculoskeletal type pain.  Given Toradol, lidocaine patch.  Given prescription for Motrin and lidocaine patches.  Given home exercises.  No concern for cauda equina or other spinal cord issues given her history and physical.  She denies any loss of bowel or bladder.  No saddle anesthesia.  Discharged from ED in good condition and told her return to the ED  if symptoms worsen.  Recommend follow-up with primary care doctor.  This chart was dictated using voice recognition software.   Despite best efforts to proofread,  errors can occur which can change the documentation meaning.   Final Clinical Impressions(s) / ED Diagnoses   Final diagnoses:  Back spasm    ED Discharge Orders         Ordered    ibuprofen (ADVIL,MOTRIN) 800 MG tablet  Every 8 hours PRN     03/25/19 1542    lidocaine (LIDODERM) 5 %  Every 24 hours     03/25/19 1542           Loghan Kurtzman, DO 03/25/19 1551

## 2019-03-25 NOTE — ED Notes (Signed)
Bed: WA04 Expected date:  Expected time:  Means of arrival:  Comments: EMS-back pain 

## 2019-04-23 DIAGNOSIS — R200 Anesthesia of skin: Secondary | ICD-10-CM | POA: Insufficient documentation

## 2019-06-30 ENCOUNTER — Ambulatory Visit: Payer: Self-pay | Admitting: Neurology

## 2019-07-05 ENCOUNTER — Encounter: Payer: Self-pay | Admitting: Neurology

## 2019-07-05 ENCOUNTER — Telehealth: Payer: Self-pay | Admitting: Neurology

## 2019-07-05 NOTE — Telephone Encounter (Signed)
Sent pt letter of r/s - cancellation on 07/05/19 after multiple attempts of calling pt

## 2019-07-05 NOTE — Telephone Encounter (Signed)
07/05/19 Tried to reach pt. To r/s. Pt. Appt. Was accidentally scheduled for 2025 and needs to be in July 2020 per Dr. Leonie Man.

## 2019-08-02 ENCOUNTER — Other Ambulatory Visit: Payer: Self-pay | Admitting: Physician Assistant

## 2019-08-02 DIAGNOSIS — Z1231 Encounter for screening mammogram for malignant neoplasm of breast: Secondary | ICD-10-CM

## 2019-08-05 ENCOUNTER — Ambulatory Visit: Payer: Medicare Other

## 2019-09-17 ENCOUNTER — Ambulatory Visit
Admission: RE | Admit: 2019-09-17 | Discharge: 2019-09-17 | Disposition: A | Discharge status: Home / Self Care | Payer: Medicare Other | Source: Ambulatory Visit | Attending: Physician Assistant | Admitting: Physician Assistant

## 2019-09-17 ENCOUNTER — Other Ambulatory Visit: Payer: Self-pay

## 2019-09-17 DIAGNOSIS — Z1231 Encounter for screening mammogram for malignant neoplasm of breast: Secondary | ICD-10-CM

## 2019-09-20 ENCOUNTER — Other Ambulatory Visit: Payer: Self-pay | Admitting: Physician Assistant

## 2019-09-20 DIAGNOSIS — R928 Other abnormal and inconclusive findings on diagnostic imaging of breast: Secondary | ICD-10-CM

## 2019-09-27 ENCOUNTER — Other Ambulatory Visit: Payer: Self-pay

## 2019-09-27 ENCOUNTER — Ambulatory Visit
Admission: RE | Admit: 2019-09-27 | Discharge: 2019-09-27 | Disposition: A | Discharge status: Home / Self Care | Payer: Medicare Other | Source: Ambulatory Visit | Attending: Physician Assistant | Admitting: Physician Assistant

## 2019-09-27 ENCOUNTER — Other Ambulatory Visit: Payer: Self-pay | Admitting: Physician Assistant

## 2019-09-27 DIAGNOSIS — R928 Other abnormal and inconclusive findings on diagnostic imaging of breast: Secondary | ICD-10-CM

## 2019-09-27 DIAGNOSIS — N6310 Unspecified lump in the right breast, unspecified quadrant: Secondary | ICD-10-CM

## 2019-10-01 ENCOUNTER — Other Ambulatory Visit: Payer: Self-pay

## 2019-10-01 ENCOUNTER — Ambulatory Visit
Admission: RE | Admit: 2019-10-01 | Discharge: 2019-10-01 | Disposition: A | Discharge status: Home / Self Care | Payer: Medicare Other | Source: Ambulatory Visit | Attending: Physician Assistant | Admitting: Physician Assistant

## 2019-10-01 DIAGNOSIS — N6310 Unspecified lump in the right breast, unspecified quadrant: Secondary | ICD-10-CM

## 2019-10-19 ENCOUNTER — Other Ambulatory Visit: Payer: Self-pay | Admitting: Surgery

## 2019-10-19 DIAGNOSIS — N6452 Nipple discharge: Secondary | ICD-10-CM

## 2019-11-03 ENCOUNTER — Ambulatory Visit
Admission: RE | Admit: 2019-11-03 | Discharge: 2019-11-03 | Disposition: A | Discharge status: Home / Self Care | Payer: Medicare Other | Source: Ambulatory Visit | Attending: Surgery | Admitting: Surgery

## 2019-11-03 ENCOUNTER — Other Ambulatory Visit: Payer: Self-pay

## 2019-11-03 DIAGNOSIS — N6452 Nipple discharge: Secondary | ICD-10-CM

## 2019-11-03 MED ORDER — GADOBUTROL 1 MMOL/ML IV SOLN
8.0000 mL | Freq: Once | INTRAVENOUS | Status: AC | PRN
  Administered 2019-11-03: 8 mL via INTRAVENOUS

## 2019-11-11 NOTE — Telephone Encounter (Signed)
Referral given to new pt staff.

## 2019-12-27 ENCOUNTER — Other Ambulatory Visit: Payer: Self-pay | Admitting: Physician Assistant

## 2019-12-27 DIAGNOSIS — M8580 Other specified disorders of bone density and structure, unspecified site: Secondary | ICD-10-CM

## 2020-01-27 IMAGING — CT CT ABD-PELV W/ CM
2 of 6 series · 16 of 46 positions shown, 18 images · IV contrast (ISOVUE)
Comparison: None.

CLINICAL DATA: Severe abdominal pain, nausea, vomiting and
diarrhea. History of cholecystectomy, sarcoidosis and constipation.

EXAM:
CT ABDOMEN AND PELVIS WITH CONTRAST
TECHNIQUE: Multidetector CT imaging of the abdomen and pelvis was performed
using the standard protocol following bolus administration of
intravenous contrast.
CONTRAST:  100mL 1LTFRJ-ITT IOPAMIDOL (1LTFRJ-ITT) INJECTION 61%

[Series 2: axial st · axial · 0.64mm/px · z∈[+1134,+1504]mm · 13 of 86 slices shown, 15 images]
[im 6/86  soft-tissue]
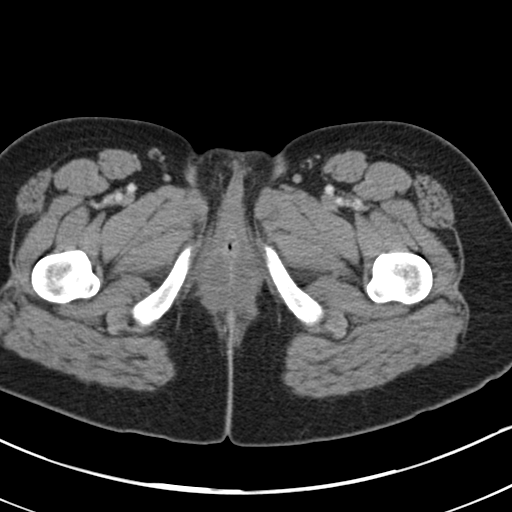
[im 6/86  bone]
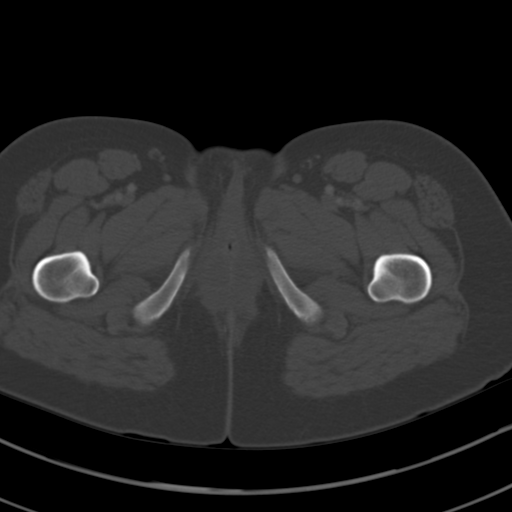
[im 11/86  soft-tissue]
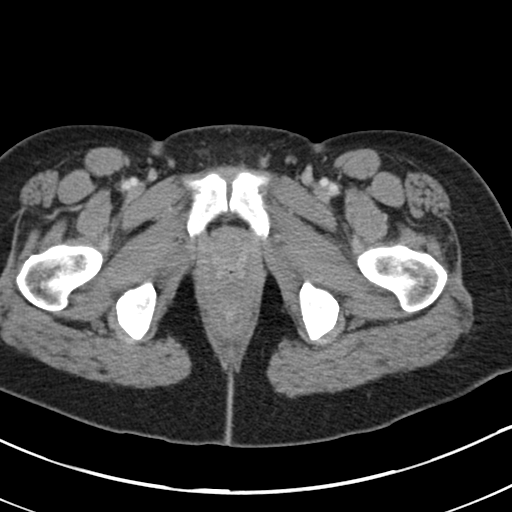
[im 16/86  soft-tissue]
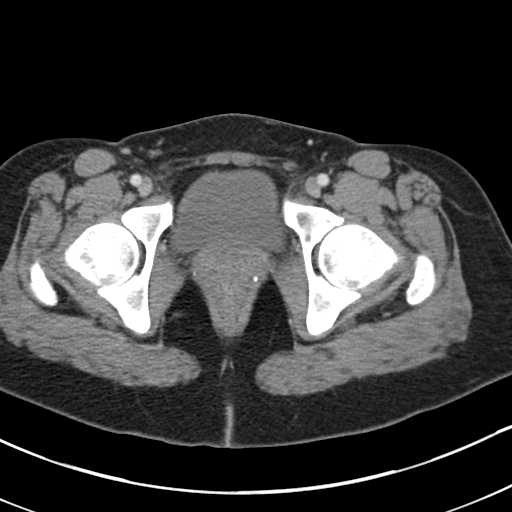
[im 27/86  soft-tissue]
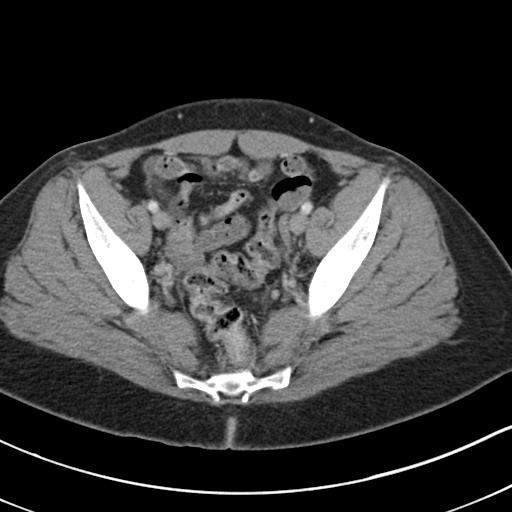
[im 32/86  soft-tissue]
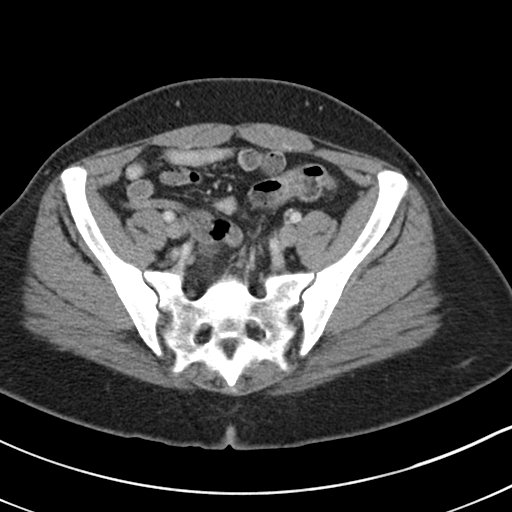
[im 38/86  soft-tissue]
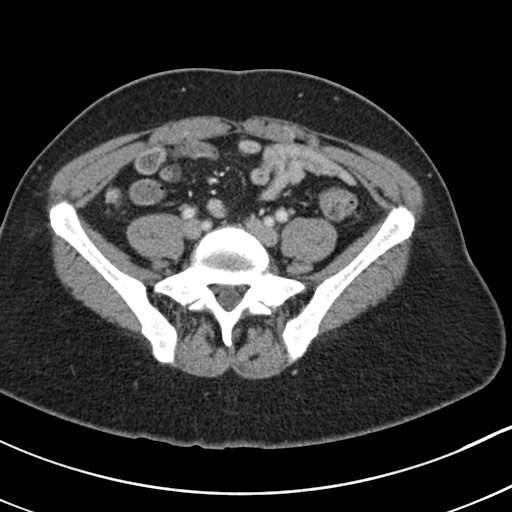
[im 43/86  soft-tissue]
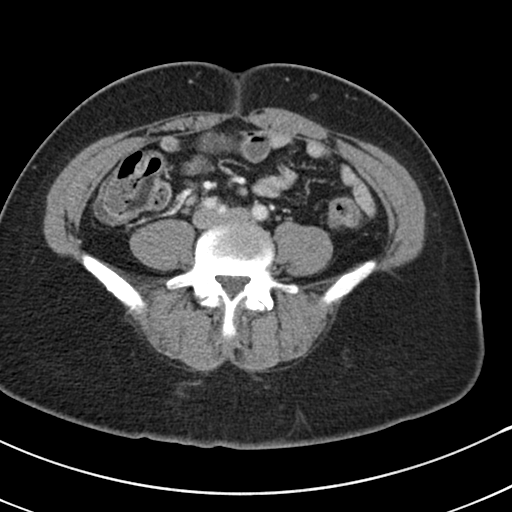
[im 48/86  soft-tissue]
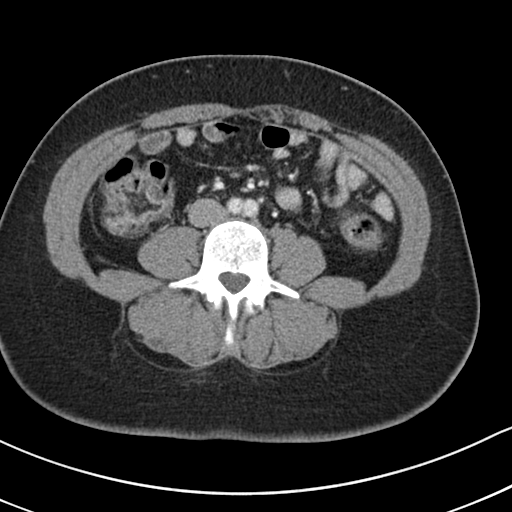
[im 54/86  soft-tissue]
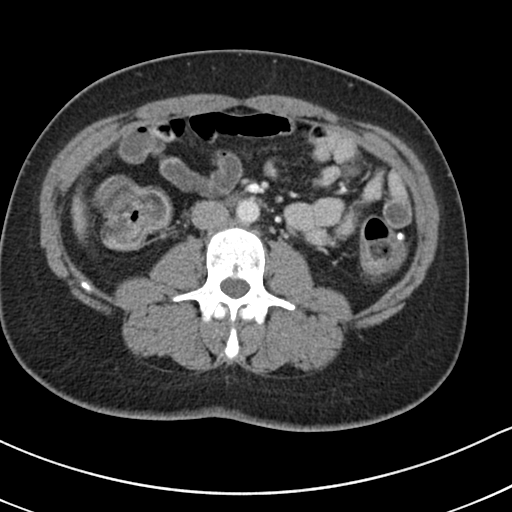
[im 54/86  bone]
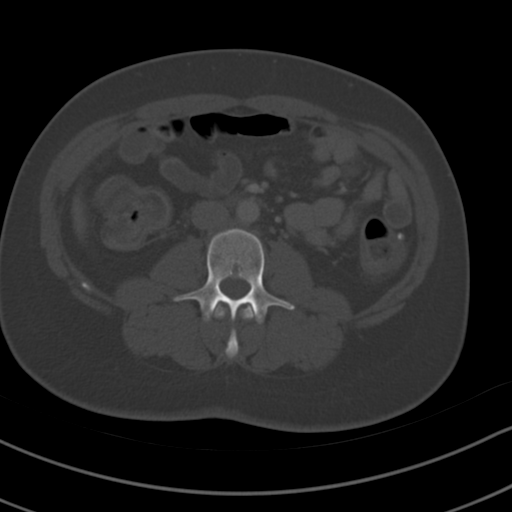
[im 59/86  soft-tissue]
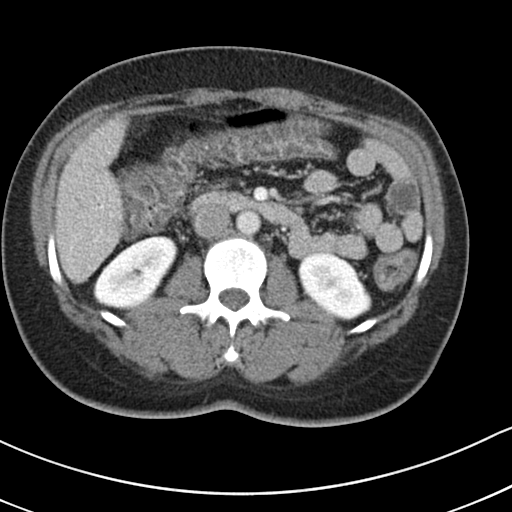
[im 70/86  soft-tissue]
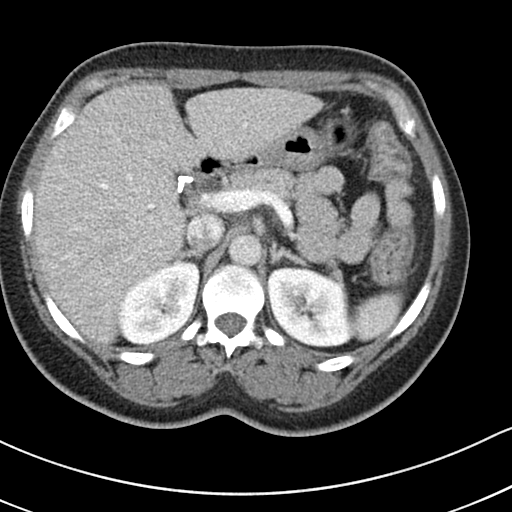
[im 75/86  soft-tissue]
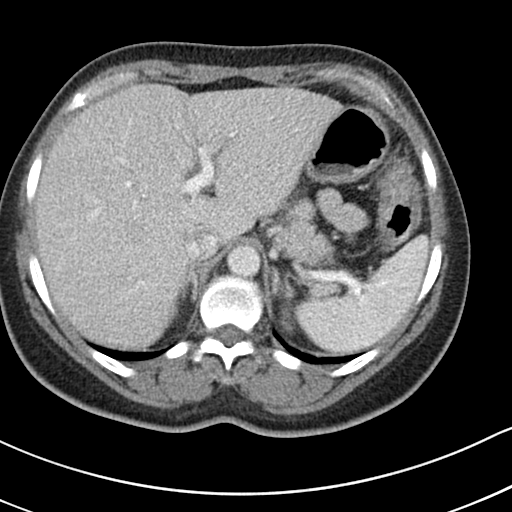
[im 80/86  soft-tissue]
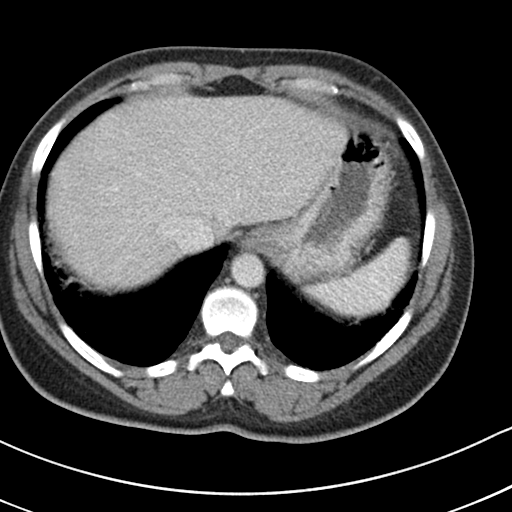

[Series 5: coronal st · coronal · 0.65mm/px · 3 of 89 slices shown]
[im 30/89  soft-tissue]
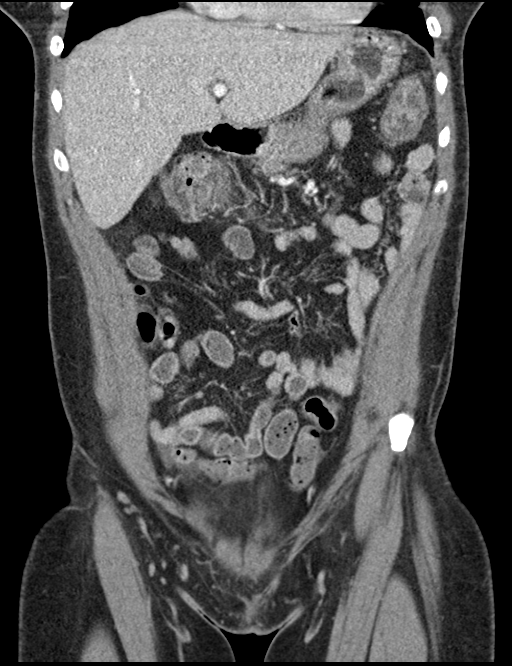
[im 40/89  soft-tissue]
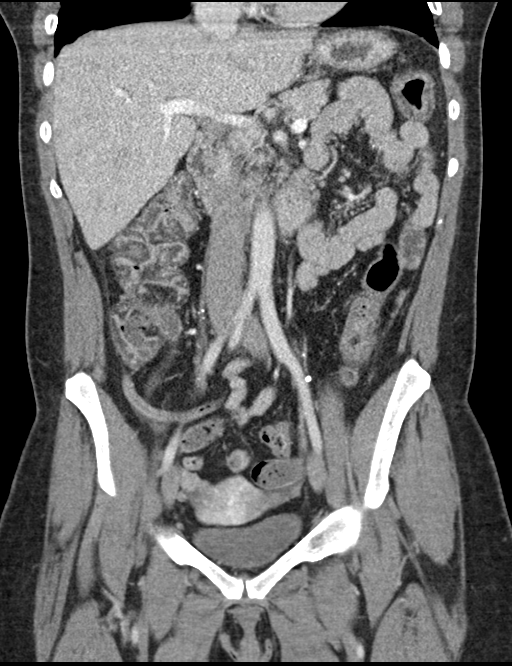
[im 49/89  soft-tissue]
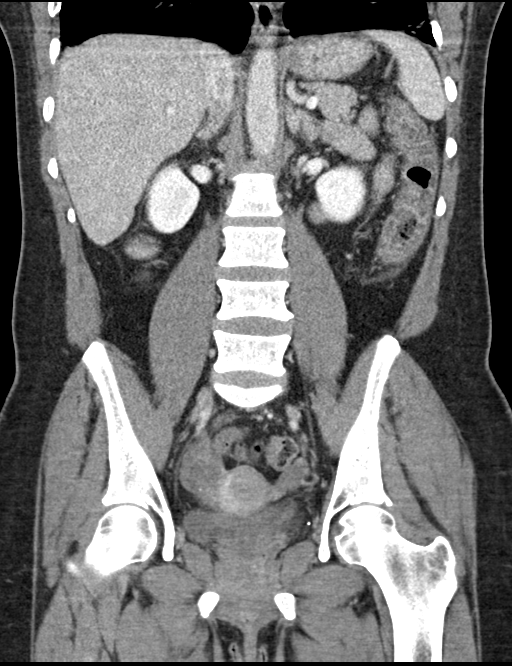

[16 of 46 positions shown; findings below may reference images not displayed]

FINDINGS: LOWER CHEST: Bibasilar scarring and linear calcifications versus
surgical suture material in lingula. No pleural effusion or focal
consolidation. Included heart size is normal.

HEPATOBILIARY: Status post cholecystectomy.  Normal liver.

PANCREAS: Normal.

SPLEEN: Normal.

ADRENALS/URINARY TRACT: Kidneys are orthotopic, demonstrating
symmetric enhancement. No nephrolithiasis, hydronephrosis or solid
renal masses. Too small to characterize RIGHT interpolar
hypodensity. The unopacified ureters are normal in course and
caliber. Delayed imaging through the kidneys demonstrates symmetric
prompt contrast excretion within the proximal urinary collecting
system. Urinary bladder is partially distended and unremarkable.
Normal adrenal glands.

STOMACH/BOWEL: Colonic wall thickening and edema from cecum through
descending colon with pericolonic fat stranding. Mild descending and
sigmoid colonic diverticulosis. Small amount of small bowel feces
compatible with chronic stasis. The stomach, small bowel are normal
in course and caliber without inflammatory changes. Mild proximal
appendix mural enhancement, likely secondary to adjacent colitis as
remainder of appendix is normal.

VASCULAR/LYMPHATIC: Aortoiliac vessels are normal in course and
caliber. No lymphadenopathy by CT size criteria.

REPRODUCTIVE: Normal.

OTHER: Minimal free fluid in the pelvis. No focal fluid collection
or intraperitoneal free air.

MUSCULOSKELETAL: Nonacute.  Small L4-5 and L5-S1 disc bulges.
IMPRESSION: 1. Nonspecific colitis without complication.

## 2020-03-20 ENCOUNTER — Other Ambulatory Visit: Payer: Medicare Other

## 2020-07-13 DIAGNOSIS — G5602 Carpal tunnel syndrome, left upper limb: Secondary | ICD-10-CM | POA: Insufficient documentation

## 2020-07-14 DIAGNOSIS — D069 Carcinoma in situ of cervix, unspecified: Secondary | ICD-10-CM | POA: Insufficient documentation

## 2021-01-09 ENCOUNTER — Other Ambulatory Visit: Payer: Self-pay | Admitting: Physician Assistant

## 2021-01-09 DIAGNOSIS — Z1231 Encounter for screening mammogram for malignant neoplasm of breast: Secondary | ICD-10-CM

## 2021-01-25 ENCOUNTER — Emergency Department (HOSPITAL_COMMUNITY)
Admission: EM | Admit: 2021-01-25 | Discharge: 2021-01-25 | Disposition: A | Discharge status: Home / Self Care | Payer: Medicare Other | Attending: Emergency Medicine | Admitting: Emergency Medicine

## 2021-01-25 ENCOUNTER — Other Ambulatory Visit: Payer: Self-pay

## 2021-01-25 ENCOUNTER — Encounter (HOSPITAL_COMMUNITY): Payer: Self-pay

## 2021-01-25 DIAGNOSIS — J45909 Unspecified asthma, uncomplicated: Secondary | ICD-10-CM | POA: Diagnosis not present

## 2021-01-25 DIAGNOSIS — J449 Chronic obstructive pulmonary disease, unspecified: Secondary | ICD-10-CM | POA: Diagnosis not present

## 2021-01-25 DIAGNOSIS — Z7951 Long term (current) use of inhaled steroids: Secondary | ICD-10-CM | POA: Diagnosis not present

## 2021-01-25 DIAGNOSIS — I110 Hypertensive heart disease with heart failure: Secondary | ICD-10-CM | POA: Insufficient documentation

## 2021-01-25 DIAGNOSIS — Z79899 Other long term (current) drug therapy: Secondary | ICD-10-CM | POA: Diagnosis not present

## 2021-01-25 DIAGNOSIS — Z7982 Long term (current) use of aspirin: Secondary | ICD-10-CM | POA: Diagnosis not present

## 2021-01-25 DIAGNOSIS — M5431 Sciatica, right side: Secondary | ICD-10-CM | POA: Diagnosis not present

## 2021-01-25 DIAGNOSIS — I5032 Chronic diastolic (congestive) heart failure: Secondary | ICD-10-CM | POA: Insufficient documentation

## 2021-01-25 DIAGNOSIS — Z87891 Personal history of nicotine dependence: Secondary | ICD-10-CM | POA: Insufficient documentation

## 2021-01-25 MED ORDER — LIDOCAINE 5 % EX PTCH: 1.0000 | MEDICATED_PATCH | CUTANEOUS | 0 refills | Status: DC

## 2021-01-25 MED ORDER — KETOROLAC TROMETHAMINE 30 MG/ML IJ SOLN
30.0000 mg | Freq: Once | INTRAMUSCULAR | Status: AC
  Administered 2021-01-25: 30 mg via INTRAVENOUS
  Filled 2021-01-25: qty 1

## 2021-01-25 MED ORDER — LIDOCAINE 5 % EX PTCH
1.0000 | MEDICATED_PATCH | CUTANEOUS | Status: DC
Start: 2021-01-25 — End: 2021-01-25
  Administered 2021-01-25: 1 via TRANSDERMAL
  Filled 2021-01-25: qty 1

## 2021-01-25 MED ORDER — DICLOFENAC SODIUM 1 % EX GEL: 4.0000 g | Freq: Four times a day (QID) | CUTANEOUS | 0 refills | Status: AC

## 2021-01-25 NOTE — Discharge Instructions (Addendum)
Follow-up with your primary care provider as discussed, sciatica will likely benefit from physical therapy. Apply Lidoderm patch as prescribed.  Apply Voltaren gel as prescribed.  You may also take Motrin and Tylenol as directed as needed.

## 2021-01-25 NOTE — ED Triage Notes (Signed)
Pt complains of lower back pain that radiates down her right leg since Monday, no injury

## 2021-01-25 NOTE — ED Provider Notes (Signed)
[facility]-EMERGENCY DEPT Provider Note   CSN: [id number] Arrival date & time: 01/25/21  0551     History No chief complaint on file.   [name] is a 50 y.o. female.  50 year old female presents with complaint of sciatica with complaint of pain in her right buttock that radiates to the right lateral thigh to the level of the knee.  Denies falls or injuries, reports this to be a recurrent problem.  Patient is taking ibuprofen at home without relief.  No other complaints or concerns.        Past Medical History:  Diagnosis Date  . Allergic rhinitis due to pollen   . Allergic rhinitis due to pollen   . Asthma   . Bipolar disorder (HCC)   . CHF (congestive heart failure) (HCC)   . Cocaine dependence (HCC) 2012  . Depressive disorder, not elsewhere classified   . Hypertension   . Obesity, unspecified   . Osteoporosis, unspecified   . Other abnormal blood chemistry   . Other and unspecified hyperlipidemia   . Other and unspecified hyperlipidemia   . Other convulsions   . Other malaise and fatigue   . Reflux esophagitis   . Sarcoidosis   . Seizure disorder (HCC)   . Tobacco use disorder    stopped smoking  . Unspecified constipation   . Unspecified hereditary and idiopathic peripheral neuropathy   . Unspecified late effects of cerebrovascular disease   . Unspecified sleep apnea   . Unspecified vitamin D deficiency     Patient Active Problem List   Diagnosis Date Noted  . Hypertensive urgency 01/04/2019  . Acute colitis 01/04/2019  . Colitis, acute 01/04/2019  . Nausea and vomiting   . History of completed stroke   . Meralgia paresthetica of right side 04/12/2015  . Hyperlipidemia LDL goal <100 12/20/2014  . Chronic diastolic congestive heart failure (HCC) 04/27/2014  . s 04/27/2014  . 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 Encounter for therapeutic drug monitoring 04/27/2014  . COPD (chronic obstructive pulmonary disease) (HCC)  04/05/2014  . CHF (congestive heart failure) (HCC) 04/05/2014  . Muscle weakness of right arm 04/05/2014  . Tinea versicolor 09/08/2013  . Hyperlipidemia with target LDL less than 130 06/08/2013  . Peripheral neuropathy 06/08/2013  . Essential hypertension, benign 06/08/2013  . Late effects of CVA (cerebrovascular accident) 06/08/2013  . GERD (gastroesophageal reflux disease) 06/08/2013  . Constipation 06/08/2013  . Chronic low back pain 06/08/2013  . Insomnia 06/08/2013    Past Surgical History:  Procedure Laterality Date  . CHOLECYSTECTOMY    . FRACTURE SURGERY     right arm  . LUNG BIOPSY       OB History   No obstetric history on file.     Family History  Problem Relation Age of Onset  . Diabetes Mother   . Hypertension Mother   . Hypertension Father   . Stroke Father   . Cancer Paternal Uncle        breast    Social History   Tobacco Use  . Smoking status: Former Smoker    Quit date: 05/30/2013    Years since quitting: 7.6  . Smokeless tobacco: Never Used  Substance Use Topics  . Alcohol use: No  . Drug use: No    Home Medications Prior to Admission medications   Medication Sig Start Date End Date Taking? Authorizing Provider  diclofenac Sodium (VOLTAREN) 1 % GEL Apply 4 g topically 4 (four) times daily.  01/25/21  Yes Jeannie Fend, PA-C  lidocaine (LIDODERM) 5 % Place 1 patch onto the skin daily. Remove & Discard patch within 12 hours or as directed by MD 01/25/21  Yes Jeannie Fend, PA-C  albuterol (PROVENTIL HFA;VENTOLIN HFA) 108 (90 BASE) MCG/ACT inhaler Inhale 2 puffs into the lungs every 6 (six) hours as needed for wheezing or shortness of breath. 08/09/14   Oneal Grout, MD  albuterol (VENTOLIN HFA) 108 (90 Base) MCG/ACT inhaler INHALE TWO PUFFS INTO LUNGS EVERY 6 HOURS AS NEEDED FOR WHEEZING 03/26/19   [provider]  amLODipine (NORVASC) 10 MG tablet TAKE ONE TABLET BY MOUTH ONCE DAILY 04/23/19   [provider]  aspirin 81 MG  tablet Take 1 tablet (81 mg total) by mouth daily. 04/05/14   Oneal Grout, MD  citalopram (CELEXA) 20 MG tablet Take 1 tablet (20 mg total) by mouth daily. Patient not taking: Reported on 04/12/2015 12/20/14   Oneal Grout, MD  dicyclomine (BENTYL) 20 MG tablet Take 1 tablet (20 mg total) by mouth 2 (two) times daily. 01/02/19   Fawze, Mina A, PA-C  hydrochlorothiazide (HYDRODIURIL) 25 MG tablet Take by mouth. 04/30/19   [provider]  ibuprofen (ADVIL,MOTRIN) 800 MG tablet Take 1 tablet (800 mg total) by mouth every 8 (eight) hours as needed for up to 30 doses. 03/25/19   Virgina Norfolk, DO  linaclotide Karlene Einstein) 72 MCG capsule Take by mouth. 05/28/19   [provider]  lisinopril (PRINIVIL,ZESTRIL) 20 MG tablet Take one tablet by mouth once daily for blood pressure 08/09/14   Oneal Grout, MD  metoprolol succinate (TOPROL-XL) 25 MG 24 hr tablet Take 0.5 tablets (12.5 mg total) by mouth daily. 01/08/19   Alwyn Ren, MD  pravastatin (PRAVACHOL) 10 MG tablet Take 1 tablet (10 mg total) by mouth daily at 6 PM. 01/07/19   Alwyn Ren, MD  saccharomyces boulardii (FLORASTOR) 250 MG capsule Take 1 capsule (250 mg total) by mouth 2 (two) times daily. 01/07/19   Alwyn Ren, MD    Allergies    Quetiapine fumarate, Risperdal [risperidone], Naproxen, Risperidone and related, and Seroquel [quetiapine fumarate]  Review of Systems   Review of Systems  Constitutional: Negative for fever.  Gastrointestinal: Negative for abdominal pain.  Musculoskeletal: Positive for back pain.  Skin: Negative for color change, rash and wound.  Neurological: Negative for weakness and numbness.  All other systems reviewed and are negative.   Physical Exam Updated Vital Signs BP 119/81   Pulse (!) 54   Temp 98.6 F (37 C) (Oral)   Resp 16   SpO2 97%   Physical Exam Vitals and nursing note reviewed.  Constitutional:      General: She is not in acute distress.     Appearance: She is well-developed and well-nourished. She is not diaphoretic.  HENT:     Head: Normocephalic and atraumatic.  Pulmonary:     Effort: Pulmonary effort is normal.  Abdominal:     Palpations: Abdomen is soft.     Tenderness: There is no abdominal tenderness.  Musculoskeletal:        General: Tenderness present. No swelling or deformity.     Right lower leg: No edema.     Left lower leg: No edema.     Comments: Tenderness with palpation at right SI joint  Skin:    General: Skin is warm and dry.     Findings: No erythema or rash.  Neurological:     Mental  Status: She is alert and oriented to person, place, and time.  Psychiatric:        Mood and Affect: Mood and affect normal.        Behavior: Behavior normal.     ED Results / Procedures / Treatments   Labs (all labs ordered are listed, but only abnormal results are displayed) Labs Reviewed - No data to display  EKG None  Radiology No results found.  Procedures Procedures   Medications Ordered in ED Medications  lidocaine (LIDODERM) 5 % 1 patch (1 patch Transdermal Patch Applied 01/25/21 0812)  ketorolac (TORADOL) 30 MG/ML injection 30 mg (30 mg Intravenous Given 01/25/21 5400)    ED Course  I have reviewed the triage vital signs and the nursing notes.  Pertinent labs & imaging results that were available during my care of the patient were reviewed by me and considered in my medical decision making (see chart for details).  Clinical Course as of 01/25/21 0833  Thu Jan 25, 2021  [id number] 50 year old female with complaint of sciatica, found to tenderness of the right SI joint.  Normal lower extremity strength and reflexes.  Plan is to treat with Toradol IM and Lidoderm in the ED.  Given prescription for Lidoderm patch as well as Voltaren gel and advised to follow-up with PCP for PT referral and recheck. [LM]    Clinical Course User Index [LM] Alden Hipp   MDM Rules/Calculators/A&P                           Final Clinical Impression(s) / ED Diagnoses Final diagnoses:  Sciatica of right side    Rx / DC Orders ED Discharge Orders         Ordered    diclofenac Sodium (VOLTAREN) 1 % GEL  4 times daily        01/25/21 0803    lidocaine (LIDODERM) 5 %  Every 24 hours        01/25/21 0803           Jeannie Fend, PA-C 01/25/21 8676    Lorre Nick, MD 01/26/21 [id number]

## 2021-09-23 ENCOUNTER — Other Ambulatory Visit: Payer: Self-pay

## 2021-09-23 ENCOUNTER — Ambulatory Visit
Admission: EM | Admit: 2021-09-23 | Discharge: 2021-09-23 | Disposition: A | Discharge status: Home / Self Care | Payer: Medicare Other | Attending: Internal Medicine | Admitting: Internal Medicine

## 2021-09-23 ENCOUNTER — Encounter: Payer: Self-pay | Admitting: Emergency Medicine

## 2021-09-23 DIAGNOSIS — B354 Tinea corporis: Secondary | ICD-10-CM | POA: Diagnosis not present

## 2021-09-23 MED ORDER — FLUCONAZOLE 150 MG PO TABS: 150.0000 mg | ORAL_TABLET | ORAL | 0 refills | Status: AC

## 2021-09-23 NOTE — ED Provider Notes (Signed)
[facility]    CSN: [id number] Arrival date & time: 09/23/21  1403      History   Chief Complaint Chief Complaint  Patient presents with   Rash    HPI [name] is a 50 y.o. female.   Patient presents with rash that has been present for approximately 2 weeks that is diffuse throughout her body.  Patient has been using ketoconazole cream and Diflucan that was prescribed by her PCP on 08/30/21.  Patient took 1 Diflucan pill.  Rash has shown minimal improvement.  Rash is itchy.  Patient denies any changes to lotions, soaps, detergents, foods, etc.  Denies any fevers.   Rash  Past Medical History:  Diagnosis Date   Allergic rhinitis due to pollen    Allergic rhinitis due to pollen    Asthma    Bipolar disorder (HCC)    CHF (congestive heart failure) (HCC)    Cocaine dependence (HCC) 2012   Depressive disorder, not elsewhere classified    Hypertension    Obesity, unspecified    Osteoporosis, unspecified    Other abnormal blood chemistry    Other and unspecified hyperlipidemia    Other and unspecified hyperlipidemia    Other convulsions    Other malaise and fatigue    Reflux esophagitis    Sarcoidosis    Seizure disorder (HCC)    Tobacco use disorder    stopped smoking   Unspecified constipation    Unspecified hereditary and idiopathic peripheral neuropathy    Unspecified late effects of cerebrovascular disease    Unspecified sleep apnea    Unspecified vitamin D deficiency     Patient Active Problem List   Diagnosis Date Noted   Hypertensive urgency 01/04/2019   Acute colitis 01/04/2019   Colitis, acute 01/04/2019   Nausea and vomiting    History of completed stroke    Meralgia paresthetica of right side 04/12/2015   Hyperlipidemia LDL goal <100 12/20/2014   Chronic diastolic congestive heart failure (HCC) 04/27/2014   s 04/27/2014   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   Encounter for therapeutic drug monitoring 04/27/2014   COPD (chronic  obstructive pulmonary disease) (HCC) 04/05/2014   CHF (congestive heart failure) (HCC) 04/05/2014   Muscle weakness of right arm 04/05/2014   Tinea versicolor 09/08/2013   Hyperlipidemia with target LDL less than 130 06/08/2013   Peripheral neuropathy 06/08/2013   Essential hypertension, benign 06/08/2013   Late effects of CVA (cerebrovascular accident) 06/08/2013   GERD (gastroesophageal reflux disease) 06/08/2013   Constipation 06/08/2013   Chronic low back pain 06/08/2013   Insomnia 06/08/2013    Past Surgical History:  Procedure Laterality Date   CHOLECYSTECTOMY     FRACTURE SURGERY     right arm   LUNG BIOPSY      OB History   No obstetric history on file.      Home Medications    Prior to Admission medications   Medication Sig Start Date End Date Taking? Authorizing Provider  fluconazole (DIFLUCAN) 150 MG tablet Take 1 tablet (150 mg total) by mouth once a week. Take 1 pill once weekly for 2 to 4 weeks. 09/23/21  Yes Lance Muss, FNP  albuterol (PROVENTIL HFA;VENTOLIN HFA) 108 (90 BASE) MCG/ACT inhaler Inhale 2 puffs into the lungs every 6 (six) hours as needed for wheezing or shortness of breath. 08/09/14   Oneal Grout, MD  albuterol (VENTOLIN HFA) 108 (90 Base) MCG/ACT inhaler INHALE TWO PUFFS INTO LUNGS EVERY 6 HOURS AS NEEDED  FOR WHEEZING 03/26/19   [provider]  amLODipine (NORVASC) 10 MG tablet TAKE ONE TABLET BY MOUTH ONCE DAILY 04/23/19   [provider]  aspirin 81 MG tablet Take 1 tablet (81 mg total) by mouth daily. 04/05/14   Oneal Grout, MD  citalopram (CELEXA) 20 MG tablet Take 1 tablet (20 mg total) by mouth daily. Patient not taking: Reported on 04/12/2015 12/20/14   Oneal Grout, MD  diclofenac Sodium (VOLTAREN) 1 % GEL Apply 4 g topically 4 (four) times daily. 01/25/21   Jeannie Fend, PA-C  dicyclomine (BENTYL) 20 MG tablet Take 1 tablet (20 mg total) by mouth 2 (two) times daily. 01/02/19   Fawze, Mina A, PA-C   hydrochlorothiazide (HYDRODIURIL) 25 MG tablet Take by mouth. 04/30/19   [provider]  ibuprofen (ADVIL,MOTRIN) 800 MG tablet Take 1 tablet (800 mg total) by mouth every 8 (eight) hours as needed for up to 30 doses. 03/25/19   Curatolo, Adam, DO  lidocaine (LIDODERM) 5 % Place 1 patch onto the skin daily. Remove & Discard patch within 12 hours or as directed by MD 01/25/21   Jeannie Fend, PA-C  linaclotide Karlene Einstein) 72 MCG capsule Take by mouth. 05/28/19   [provider]  lisinopril (PRINIVIL,ZESTRIL) 20 MG tablet Take one tablet by mouth once daily for blood pressure 08/09/14   Oneal Grout, MD  metoprolol succinate (TOPROL-XL) 25 MG 24 hr tablet Take 0.5 tablets (12.5 mg total) by mouth daily. 01/08/19   Alwyn Ren, MD  pravastatin (PRAVACHOL) 10 MG tablet Take 1 tablet (10 mg total) by mouth daily at 6 PM. 01/07/19   Alwyn Ren, MD  saccharomyces boulardii (FLORASTOR) 250 MG capsule Take 1 capsule (250 mg total) by mouth 2 (two) times daily. 01/07/19   Alwyn Ren, MD    Family History Family History  Problem Relation Age of Onset   Diabetes Mother    Hypertension Mother    Hypertension Father    Stroke Father    Cancer Paternal Uncle        breast    Social History Social History   Tobacco Use   Smoking status: Former    Types: Cigarettes    Quit date: 05/30/2013    Years since quitting: 8.3   Smokeless tobacco: Never  Substance Use Topics   Alcohol use: No   Drug use: No     Allergies   Quetiapine fumarate, Risperdal [risperidone], Naproxen, Risperidone and related, and Seroquel [quetiapine fumarate]   Review of Systems Review of Systems Per HPI  Physical Exam Triage Vital Signs ED Triage Vitals [09/23/21 1424]  Enc Vitals Group     BP (!) 148/91     Pulse Rate 66     Resp 16     Temp 97.7 F (36.5 C)     Temp Source Oral     SpO2 95 %     Weight      Height      Head Circumference      Peak Flow      Pain  Score 0     Pain Loc      Pain Edu?      Excl. in GC?    No data found.  Updated Vital Signs BP (!) 148/91 (BP Location: Left Arm)   Pulse 66   Temp 97.7 F (36.5 C) (Oral)   Resp 16   SpO2 95%   Visual Acuity Right Eye Distance:   Left  Eye Distance:   Bilateral Distance:    Right Eye Near:   Left Eye Near:    Bilateral Near:     Physical Exam Constitutional:      Appearance: Normal appearance.  HENT:     Head: Normocephalic and atraumatic.  Eyes:     Extraocular Movements: Extraocular movements intact.     Conjunctiva/sclera: Conjunctivae normal.  Pulmonary:     Effort: Pulmonary effort is normal.  Skin:    General: Skin is warm and dry.     Findings: Rash present.     Comments: Diffuse erythematous, circular rashes present throughout majority of body including upper extremities, upper chest back, bilateral lower extremities.  Neurological:     General: No focal deficit present.     Mental Status: She is alert and oriented to person, place, and time. Mental status is at baseline.  Psychiatric:        Mood and Affect: Mood normal.        Behavior: Behavior normal.        Thought Content: Thought content normal.        Judgment: Judgment normal.     UC Treatments / Results  Labs (all labs ordered are listed, but only abnormal results are displayed) Labs Reviewed - No data to display  EKG   Radiology No results found.  Procedures Procedures (including critical care time)  Medications Ordered in UC Medications - No data to display  Initial Impression / Assessment and Plan / UC Course  I have reviewed the triage vital signs and the nursing notes.  Pertinent labs & imaging results that were available during my care of the patient were reviewed by me and considered in my medical decision making (see chart for details).     Rash is most consistent with tinea corporis.  Patient to continue ketoconazole cream.  Patient was prescribed Diflucan to take  once weekly for 2 to 4 weeks.  Advised patient to follow-up with urgent care or primary care if rash does not improve with this current treatment plan.Discussed strict return precautions. Patient verbalized understanding and is agreeable with plan.  Final Clinical Impressions(s) / UC Diagnoses   Final diagnoses:  Tinea corporis     Discharge Instructions      Please follow-up with primary care physician if symptoms do not resolve.     ED Prescriptions     Medication Sig Dispense Auth. Provider   fluconazole (DIFLUCAN) 150 MG tablet Take 1 tablet (150 mg total) by mouth once a week. Take 1 pill once weekly for 2 to 4 weeks. 4 tablet Lance Muss, FNP      PDMP not reviewed this encounter.   Lance Muss, FNP 09/23/21 [phone]

## 2021-09-23 NOTE — ED Triage Notes (Signed)
2 week long rash. PCP prescribed antibiotic cream without improvement. C/o itching

## 2021-09-23 NOTE — Discharge Instructions (Addendum)
Please follow-up with primary care physician if symptoms do not resolve.

## 2022-05-06 ENCOUNTER — Emergency Department (HOSPITAL_COMMUNITY)
Admission: EM | Admit: 2022-05-06 | Discharge: 2022-05-06 | Disposition: A | Discharge status: Home / Self Care | Payer: Medicare Other | Attending: Emergency Medicine | Admitting: Emergency Medicine

## 2022-05-06 ENCOUNTER — Emergency Department (HOSPITAL_COMMUNITY): Payer: Medicare Other

## 2022-05-06 ENCOUNTER — Encounter (HOSPITAL_COMMUNITY): Payer: Self-pay | Admitting: Emergency Medicine

## 2022-05-06 DIAGNOSIS — Z7982 Long term (current) use of aspirin: Secondary | ICD-10-CM | POA: Diagnosis not present

## 2022-05-06 DIAGNOSIS — I10 Essential (primary) hypertension: Secondary | ICD-10-CM | POA: Insufficient documentation

## 2022-05-06 DIAGNOSIS — R519 Headache, unspecified: Secondary | ICD-10-CM | POA: Insufficient documentation

## 2022-05-06 DIAGNOSIS — M79602 Pain in left arm: Secondary | ICD-10-CM | POA: Diagnosis not present

## 2022-05-06 DIAGNOSIS — Z79899 Other long term (current) drug therapy: Secondary | ICD-10-CM | POA: Insufficient documentation

## 2022-05-06 DIAGNOSIS — M545 Low back pain, unspecified: Secondary | ICD-10-CM | POA: Diagnosis present

## 2022-05-06 DIAGNOSIS — R531 Weakness: Secondary | ICD-10-CM | POA: Insufficient documentation

## 2022-05-06 DIAGNOSIS — R202 Paresthesia of skin: Secondary | ICD-10-CM | POA: Insufficient documentation

## 2022-05-06 DIAGNOSIS — J449 Chronic obstructive pulmonary disease, unspecified: Secondary | ICD-10-CM | POA: Diagnosis not present

## 2022-05-06 LAB — CBC
HCT: 40.7 % (ref 36.0–46.0)
Hemoglobin: 13.7 g/dL (ref 12.0–15.0)
MCH: 31.5 pg (ref 26.0–34.0)
MCHC: 33.7 g/dL (ref 30.0–36.0)
MCV: 93.6 fL (ref 80.0–100.0)
Platelets: 311 10*3/uL (ref 150–400)
RBC: 4.35 MIL/uL (ref 3.87–5.11)
RDW: 12 % (ref 11.5–15.5)
WBC: 9.3 10*3/uL (ref 4.0–10.5)
nRBC: 0 % (ref 0.0–0.2)

## 2022-05-06 LAB — BASIC METABOLIC PANEL
Anion gap: 9 (ref 5–15)
BUN: 12 mg/dL (ref 6–20)
CO2: 25 mmol/L (ref 22–32)
Calcium: 9.1 mg/dL (ref 8.9–10.3)
Chloride: 102 mmol/L (ref 98–111)
Creatinine, Ser: 0.66 mg/dL (ref 0.44–1.00)
GFR, Estimated: >60 mL/min (ref >60)
Glucose, Bld: 105 mg/dL — ABNORMAL HIGH (ref 70–99)
Potassium: 2.9 mmol/L — ABNORMAL LOW (ref 3.5–5.1)
Sodium: 136 mmol/L (ref 135–145)

## 2022-05-06 LAB — MAGNESIUM: Magnesium: 2 mg/dL (ref 1.7–2.4)

## 2022-05-06 MED ORDER — POTASSIUM CHLORIDE CRYS ER 20 MEQ PO TBCR
40.0000 meq | EXTENDED_RELEASE_TABLET | Freq: Once | ORAL | Status: AC
  Administered 2022-05-06: 40 meq via ORAL
  Filled 2022-05-06: qty 2

## 2022-05-06 MED ORDER — OXYCODONE-ACETAMINOPHEN 5-325 MG PO TABS
2.0000 | ORAL_TABLET | Freq: Once | ORAL | Status: AC
  Administered 2022-05-06: 2 via ORAL
  Filled 2022-05-06: qty 2

## 2022-05-06 MED ORDER — LIDOCAINE 5 % EX PTCH: 1.0000 | MEDICATED_PATCH | CUTANEOUS | 0 refills | Status: AC

## 2022-05-06 MED ORDER — METHOCARBAMOL 500 MG PO TABS: 500.0000 mg | ORAL_TABLET | Freq: Two times a day (BID) | ORAL | 0 refills | Status: AC

## 2022-05-06 NOTE — ED Provider Notes (Signed)
?[place] ?Provider Note ? ? ?CSN: [id number] ?Arrival date & time: 05/06/22  0851 ? ?  ? ?History ? ?Chief Complaint  ?Patient presents with  ? Back Pain  ? ? ?[name] is a 51 y.o. female with past medical history significant for hyperlipidemia, hypertension, sarcoidosis, COPD, chronic low back pain who presents with concern for worsening bilateral lower back pain, weakness of bilateral legs status post lumbar puncture performed Tuesday.  Patient was being evaluated for multiple sclerosis secondary to some numbness and tingling in bilateral feet.  Since then she reports that she had a severe headache directly following the lumbar puncture, however the back pain has been persistent.  She reports that she took oxycodone 10-3 25 to help with the pain with minimal relief.  She denies significant numbness, tingling associated with this pain.  She is unsure whether it is related but she also endorses some left arm soreness which is overall improved since it began. ? ? ?Back Pain ? ?  ? ?Home Medications ?Prior to Admission medications   ?Medication Sig Start Date End Date Taking? Authorizing Provider  ?albuterol (PROVENTIL HFA;VENTOLIN HFA) 108 (90 BASE) MCG/ACT inhaler Inhale 2 puffs into the lungs every 6 (six) hours as needed for wheezing or shortness of breath. 08/09/14   Blanchie Serve, MD  ?albuterol (VENTOLIN HFA) 108 (90 Base) MCG/ACT inhaler INHALE TWO PUFFS INTO LUNGS EVERY 6 HOURS AS NEEDED FOR WHEEZING 03/26/19   [provider]  ?amLODipine (NORVASC) 10 MG tablet TAKE ONE TABLET BY MOUTH ONCE DAILY 04/23/19   [provider]  ?aspirin 81 MG tablet Take 1 tablet (81 mg total) by mouth daily. 04/05/14   Blanchie Serve, MD  ?citalopram (CELEXA) 20 MG tablet Take 1 tablet (20 mg total) by mouth daily. ?Patient not taking: Reported on 04/12/2015 12/20/14   Blanchie Serve, MD  ?diclofenac Sodium (VOLTAREN) 1 % GEL Apply 4 g topically 4 (four) times daily.  01/25/21   Tacy Learn, PA-C  ?dicyclomine (BENTYL) 20 MG tablet Take 1 tablet (20 mg total) by mouth 2 (two) times daily. 01/02/19   Fawze, Mina A, PA-C  ?fluconazole (DIFLUCAN) 150 MG tablet Take 1 tablet (150 mg total) by mouth once a week. Take 1 pill once weekly for 2 to 4 weeks. 09/23/21   Teodora Medici, FNP  ?hydrochlorothiazide (HYDRODIURIL) 25 MG tablet Take by mouth. 04/30/19   [provider]  ?ibuprofen (ADVIL,MOTRIN) 800 MG tablet Take 1 tablet (800 mg total) by mouth every 8 (eight) hours as needed for up to 30 doses. 03/25/19   Curatolo, Adam, DO  ?lidocaine (LIDODERM) 5 % Place 1 patch onto the skin daily. Remove & Discard patch within 12 hours or as directed by MD 01/25/21   Tacy Learn, PA-C  ?linaclotide Rolan Lipa) 72 MCG capsule Take by mouth. 05/28/19   [provider]  ?lisinopril (PRINIVIL,ZESTRIL) 20 MG tablet Take one tablet by mouth once daily for blood pressure 08/09/14   Blanchie Serve, MD  ?metoprolol succinate (TOPROL-XL) 25 MG 24 hr tablet Take 0.5 tablets (12.5 mg total) by mouth daily. 01/08/19   Georgette Shell, MD  ?pravastatin (PRAVACHOL) 10 MG tablet Take 1 tablet (10 mg total) by mouth daily at 6 PM. 01/07/19   Georgette Shell, MD  ?saccharomyces boulardii (FLORASTOR) 250 MG capsule Take 1 capsule (250 mg total) by mouth 2 (two) times daily. 01/07/19   Georgette Shell, MD  ?   ? ?Allergies    ?  Quetiapine fumarate, Risperdal [risperidone], Naproxen, Risperidone and related, and Seroquel [quetiapine fumarate]   ? ?Review of Systems   ?Review of Systems  ?Musculoskeletal:  Positive for back pain.  ?All other systems reviewed and are negative. ? ?Physical Exam ?Updated Vital Signs ?BP 121/80   Pulse 60   Temp 98.4 ?F (36.9 ?C) (Oral)   Resp 16   Ht 5\' 4"  (1.626 m)   Wt 88 kg   SpO2 100%   BMI 33.30 kg/m?  ?Physical Exam ?Vitals and nursing note reviewed.  ?Constitutional:   ?   General: She is not in acute distress. ?   Appearance: Normal appearance.   ?HENT:  ?   Head: Normocephalic and atraumatic.  ?Eyes:  ?   General:     ?   Right eye: No discharge.     ?   Left eye: No discharge.  ?Cardiovascular:  ?   Rate and Rhythm: Normal rate and regular rhythm.  ?   Pulses: Normal pulses.  ?Pulmonary:  ?   Effort: Pulmonary effort is normal. No respiratory distress.  ?Musculoskeletal:     ?   General: No deformity.  ?   Comments: Strength symmetrical bilaterally.  Intact strength 5 out of 5 to flexion extension at the knee, and ankle bilaterally.  4/5 strength to flexion extension at the hip, unclear whether secondary to pain/effort.    ?Skin: ?   General: Skin is warm and dry.  ?   Capillary Refill: Capillary refill takes less than 2 seconds.  ?Neurological:  ?   Mental Status: She is alert and oriented to person, place, and time.  ?   Comments: No sensory discrepancy bilaterally on legs  ?Psychiatric:     ?   Mood and Affect: Mood normal.     ?   Behavior: Behavior normal.  ? ? ?ED Results / Procedures / Treatments   ?Labs ?(all labs ordered are listed, but only abnormal results are displayed) ?Labs Reviewed - No data to display ? ?EKG ?None ? ?Radiology ?No results found. ? ?Procedures ?Procedures  ? ? ?Medications Ordered in ED ?Medications  ?oxyCODONE-acetaminophen (PERCOCET/ROXICET) 5-325 MG per tablet 2 tablet (has no administration in time range)  ? ? ?ED Course/ Medical Decision Making/ A&P ?Clinical Course as of 05/06/22 1139  ?Mon May 06, 2022  ?1051 Potassium(!): 2.9 [JL]  ?  ?Clinical Course User Index ?[JL] Regan Lemming, MD  ? ?                        ?Medical Decision Making ?Amount and/or Complexity of Data Reviewed ?Labs: ordered. Decision-making details documented in ED Course. ?Radiology: ordered. ? ?Risk ?Prescription drug management. ? ? ?This patient presents to the ED for concern of low back pain, bilateral leg weakness status post lumbar puncture, this involves an extensive number of treatment options, and is a complaint that carries with it a  high risk of complications and morbidity. The emergent differential diagnosis prior to evaluation includes, but is not limited to, post OP back pain, continuation of chronic back pain, but must rule out LP associated blood clot, less clinical concern for epidural abscess, acute osteomyelitis, patient without infectious symptoms, purulent drainage, injection site clean and dry.  ? ?This is not an exhaustive differential.  ? ?Past Medical History / Co-morbidities / Social History: ?Chronic low back pain, hyperlipidemia, hypertension, sarcoidosis ? ?Additional history: ?Chart reviewed. Pertinent results include: recent LP showing no evidence of MS. Reviewed  outpatient neurology, PCP visits. ? ?Physical Exam: ?Physical exam performed. The pertinent findings include: Patient with no sensory discrepancy bilaterally, she has some decreased strength to flexion extension of the hip which may be secondary to pain versus possible neurologic compromise secondary to blood clot or other abnormality after lumbar puncture. Some strength returned on re-eval, increased suspicion of decreased effort 2/2 pain ? ?Lab Tests: ?I ordered, and personally interpreted labs.  The pertinent results include: Unremarkable CBC, magnesium.  BMP with hypokalemia potassium 2.9.  We will orally replete. ?  ?Imaging Studies: ?I ordered imaging studies including MRI lumbar spine. I independently visualized and interpreted imaging which showed evidence of some chronic degenerative changes, no evidence of acute clot, or other abnormality status post LP. I agree with the radiologist interpretation. ?  ?Medications: ?I ordered medication including oxycodone for pain, potassium chloride for for hypokalemia. Reevaluation of the patient after these medicines showed that the patient improved. I have reviewed the patients home medicines and have made adjustments as needed. ?  ?Disposition: ?After consideration of the diagnostic results and the patients response  to treatment, I feel that patient's presentation today is consistent with weakness, pain secondary to inflammation from chronic back pain in context of recent lumbar puncture.  Based on her results today th

## 2022-05-06 NOTE — ED Triage Notes (Signed)
Pt states she had an LP done Tuesday at [facility] to check for MS. Pt states that since then she has had lower back pain and unable to get out of bed due to pain. Both sides of back and down legs.  ?

## 2022-05-06 NOTE — Discharge Instructions (Signed)
Please use Tylenol or ibuprofen for pain.  You may use 600 mg ibuprofen every 6 hours or 1000 mg of Tylenol every 6 hours.  You may choose to alternate between the 2.  This would be most effective.  Not to exceed 4 g of Tylenol within 24 hours.  Not to exceed 3200 mg ibuprofen 24 hours. ? ?You can use the muscle relaxant I am prescribing in addition to the above up to twice daily as well as the lidocaine patches. Please follow up with you PCP at your earliest convenience. ?

## 2022-05-06 NOTE — ED Notes (Signed)
Taken to MRI by MRI tech. ?

## 2022-05-14 DIAGNOSIS — M51369 Other intervertebral disc degeneration, lumbar region without mention of lumbar back pain or lower extremity pain: Secondary | ICD-10-CM | POA: Insufficient documentation

## 2022-05-14 DIAGNOSIS — M4807 Spinal stenosis, lumbosacral region: Secondary | ICD-10-CM | POA: Insufficient documentation

## 2022-05-14 DIAGNOSIS — M47816 Spondylosis without myelopathy or radiculopathy, lumbar region: Secondary | ICD-10-CM | POA: Insufficient documentation

## 2022-06-18 DIAGNOSIS — M5416 Radiculopathy, lumbar region: Secondary | ICD-10-CM | POA: Insufficient documentation

## 2022-06-20 ENCOUNTER — Emergency Department (HOSPITAL_BASED_OUTPATIENT_CLINIC_OR_DEPARTMENT_OTHER): Payer: Medicare Other

## 2022-06-20 ENCOUNTER — Encounter (HOSPITAL_COMMUNITY): Payer: Self-pay | Admitting: Emergency Medicine

## 2022-06-20 ENCOUNTER — Emergency Department (HOSPITAL_COMMUNITY)
Admission: EM | Admit: 2022-06-20 | Discharge: 2022-06-20 | Disposition: A | Discharge status: Home / Self Care | Payer: Medicare Other | Attending: Emergency Medicine | Admitting: Emergency Medicine

## 2022-06-20 DIAGNOSIS — M5431 Sciatica, right side: Secondary | ICD-10-CM | POA: Diagnosis not present

## 2022-06-20 DIAGNOSIS — R52 Pain, unspecified: Secondary | ICD-10-CM

## 2022-06-20 DIAGNOSIS — M79661 Pain in right lower leg: Secondary | ICD-10-CM | POA: Diagnosis present

## 2022-06-20 DIAGNOSIS — Z7982 Long term (current) use of aspirin: Secondary | ICD-10-CM | POA: Diagnosis not present

## 2022-06-20 MED ORDER — FENTANYL CITRATE PF 50 MCG/ML IJ SOSY
50.0000 ug | PREFILLED_SYRINGE | Freq: Once | INTRAMUSCULAR | Status: AC
  Administered 2022-06-20: 50 ug via INTRAMUSCULAR
  Filled 2022-06-20: qty 1

## 2022-06-20 MED ORDER — PREDNISONE 10 MG PO TABS: 20.0000 mg | ORAL_TABLET | Freq: Every day | ORAL | 0 refills | Status: AC

## 2022-06-20 NOTE — ED Triage Notes (Signed)
Patient complains of right leg pain that started one month ago, patient states she thinks is may be sciatica. Patient has seen a spine specialist this passed Monday and received a Toradol injection but states pain is not improved and so she was referred to [facility].

## 2022-06-20 NOTE — Discharge Instructions (Signed)
Call your neurosurgeon today to schedule an appointment for reevaluation later this week or early next week.  In the meantime take the prednisone 40 mg for the next 5 days to help.  Ultimately I do think you will need to have more specialty care but if you are having inability to walk, urinate, lower lower extremity weakness to both sides return back to ED for further evaluation.

## 2022-06-20 NOTE — ED Provider Notes (Cosign Needed)
[name] Provider Note   CSN: [id number] Arrival date & time: 06/20/22  1041     History  Chief Complaint  Patient presents with   Leg Pain    [name] is a 51 y.o. female.   Leg Pain   Patient presents with right lower extremity pain.  Is been going on for a Estrada, Alicia states it is constant but admits is worse when Alicia raises the right upper extremity.  Atraumatic, Alicia ambulates with the assistance of a cane.  The pain starts below her buttock and goes posteriorly down the right leg to her ankle.  States the pain is constant, Alicia has tried baclofen with minimal improvement.  States Alicia had a shot of Toradol on Monday with her spinal surgeon which helped for half the day but then failed to improve.  Alicia denies any chest pain or shortness of breath, recent travel or surgeries, history of DVT or blood clots.  No fevers at home.  No chest pain or shortness of breath.  Home Medications Prior to Admission medications   Medication Sig Start Date End Date Taking? Authorizing Provider  albuterol (PROVENTIL HFA;VENTOLIN HFA) 108 (90 BASE) MCG/ACT inhaler Inhale 2 puffs into the lungs every 6 (six) hours as needed for wheezing or shortness of breath. 08/09/14   Oneal Grout, MD  albuterol (VENTOLIN HFA) 108 (90 Base) MCG/ACT inhaler INHALE TWO PUFFS INTO LUNGS EVERY 6 HOURS AS NEEDED FOR WHEEZING 03/26/19   [provider]  amLODipine (NORVASC) 10 MG tablet TAKE ONE TABLET BY MOUTH ONCE DAILY 04/23/19   [provider]  aspirin 81 MG tablet Take 1 tablet (81 mg total) by mouth daily. 04/05/14   Oneal Grout, MD  citalopram (CELEXA) 20 MG tablet Take 1 tablet (20 mg total) by mouth daily. Patient not taking: Reported on 04/12/2015 12/20/14   Oneal Grout, MD  diclofenac Sodium (VOLTAREN) 1 % GEL Apply 4 g topically 4 (four) times daily. 01/25/21   Jeannie Fend, PA-C  dicyclomine (BENTYL) 20 MG tablet Take 1 tablet (20 mg total)  by mouth 2 (two) times daily. 01/02/19   Fawze, Mina A, PA-C  fluconazole (DIFLUCAN) 150 MG tablet Take 1 tablet (150 mg total) by mouth once a week. Take 1 pill once weekly for 2 to 4 weeks. 09/23/21   Gustavus Bryant, FNP  hydrochlorothiazide (HYDRODIURIL) 25 MG tablet Take by mouth. 04/30/19   [provider]  ibuprofen (ADVIL,MOTRIN) 800 MG tablet Take 1 tablet (800 mg total) by mouth every 8 (eight) hours as needed for up to 30 doses. 03/25/19   Curatolo, Adam, DO  lidocaine (LIDODERM) 5 % Place 1 patch onto the skin daily. Remove & Discard patch within 12 hours or as directed by MD 05/06/22   Prosperi, Harrel Carina, PA-C  linaclotide Karlene Einstein) 72 MCG capsule Take by mouth. 05/28/19   [provider]  lisinopril (PRINIVIL,ZESTRIL) 20 MG tablet Take one tablet by mouth once daily for blood pressure 08/09/14   Oneal Grout, MD  methocarbamol (ROBAXIN) 500 MG tablet Take 1 tablet (500 mg total) by mouth 2 (two) times daily. 05/06/22   Prosperi, Christian H, PA-C  metoprolol succinate (TOPROL-XL) 25 MG 24 hr tablet Take 0.5 tablets (12.5 mg total) by mouth daily. 01/08/19   Alwyn Ren, MD  pravastatin (PRAVACHOL) 10 MG tablet Take 1 tablet (10 mg total) by mouth daily at 6 PM. 01/07/19   Alwyn Ren, MD  saccharomyces boulardii [facility])  250 MG capsule Take 1 capsule (250 mg total) by mouth 2 (two) times daily. 01/07/19   Alwyn Ren, MD      Allergies    Quetiapine fumarate, Risperdal [risperidone], Naproxen, Risperidone and related, and Seroquel [quetiapine fumarate]    Review of Systems   Review of Systems  Physical Exam Updated Vital Signs BP 118/71 (BP Location: Right Arm)   Pulse (!) 54   Temp 98 F (36.7 C) (Oral)   Resp 14   SpO2 97%  Physical Exam Vitals and nursing note reviewed. Exam conducted with a chaperone present.  Constitutional:      Appearance: Normal appearance.  HENT:     Head: Normocephalic and atraumatic.  Eyes:     General: No  scleral icterus.       Right eye: No discharge.        Left eye: No discharge.     Extraocular Movements: Extraocular movements intact.     Pupils: Pupils are equal, round, and reactive to light.  Cardiovascular:     Rate and Rhythm: Normal rate and regular rhythm.     Pulses: Normal pulses.     Heart sounds: Normal heart sounds. No murmur heard.    No friction rub. No gallop.     Comments: DP and PT are 2+ Pulmonary:     Effort: Pulmonary effort is normal. No respiratory distress.     Breath sounds: Normal breath sounds.  Abdominal:     General: Abdomen is flat. Bowel sounds are normal. There is no distension.     Palpations: Abdomen is soft.     Tenderness: There is no abdominal tenderness.  Musculoskeletal:        General: Tenderness present.     Comments: Lower extremities roughly symmetric bilaterally.  Tactile temperature roughly identical.  No appreciable edema or swelling but tenderness to palpation of the posterior knee and positive Homans' sign to right lower extremity.  Skin:    General: Skin is warm and dry.     Coloration: Skin is not jaundiced.  Neurological:     Mental Status: Alicia is alert. Mental status is at baseline.     Coordination: Coordination normal.     Comments: Patellar reflexes symmetric bilaterally     ED Results / Procedures / Treatments   Labs (all labs ordered are listed, but only abnormal results are displayed) Labs Reviewed - No data to display  EKG None  Radiology VAS [place] LOWER EXTREMITY VENOUS (DVT) (7a-7p)  Result Date: 06/20/2022  Lower Venous DVT Study Patient Name:  [name]  Date of Exam:   06/20/2022 Medical Rec #: [id number]       Accession #:    [id number] Date of Birth: Jun 07, 1971       Patient Gender: F Patient Age:   67 years Exam Location:  [facility] Procedure:      VAS [place] LOWER EXTREMITY VENOUS (DVT) Referring Phys: Tommy Goostree --------------------------------------------------------------------------------   Indications: Pain.  Comparison Study: No prior study Performing Technologist: Gertie Fey MHA, RDMS, RVT, RDCS  Examination Guidelines: A complete evaluation includes B-mode imaging, spectral Doppler, color Doppler, and power Doppler as needed of all accessible portions of each vessel. Bilateral testing is considered an integral part of a complete examination. Limited examinations for reoccurring indications may be performed as noted. The reflux portion of the exam is performed with the patient in reverse Trendelenburg.  +---------+---------------+---------+-----------+----------+--------------+ RIGHT    CompressibilityPhasicitySpontaneityPropertiesThrombus Aging +---------+---------------+---------+-----------+----------+--------------+ CFV  Full           Yes      Yes                                 +---------+---------------+---------+-----------+----------+--------------+ SFJ      Full                                                        +---------+---------------+---------+-----------+----------+--------------+ FV Prox  Full                                                        +---------+---------------+---------+-----------+----------+--------------+ FV Mid   Full                                                        +---------+---------------+---------+-----------+----------+--------------+ FV DistalFull                                                        +---------+---------------+---------+-----------+----------+--------------+ PFV      Full                                                        +---------+---------------+---------+-----------+----------+--------------+ POP      Full           Yes      Yes                                 +---------+---------------+---------+-----------+----------+--------------+ PTV      Full                                                         +---------+---------------+---------+-----------+----------+--------------+ PERO     Full                                                        +---------+---------------+---------+-----------+----------+--------------+   +----+---------------+---------+-----------+----------+--------------+ LEFTCompressibilityPhasicitySpontaneityPropertiesThrombus Aging +----+---------------+---------+-----------+----------+--------------+ CFV Full           Yes      Yes                                 +----+---------------+---------+-----------+----------+--------------+  Summary: RIGHT: - There is no evidence of deep vein thrombosis in the lower extremity.  - No cystic structure found in the popliteal fossa.  LEFT: - No evidence of common femoral vein obstruction.  *See table(s) above for measurements and observations.    Preliminary     Procedures Procedures    Medications Ordered in ED Medications  fentaNYL (SUBLIMAZE) injection 50 mcg (50 mcg Intramuscular Given 06/20/22 1337)    ED Course/ Medical Decision Making/ A&P                           Medical Decision Making Risk Prescription drug management.   Patient presents due to right lower extremity pain.  Differential includes not limited to sciatica, DVT, cellulitis, MSK case strain, compartment syndrome.  On exam patient's lower extremity is soft, Alicia is neurovascular intact with brisk cap refill and DP and PT are 2+.  There is no pitting edema, warmth or erythema.  I do not think is a cellulitis or compartment syndrome.  DVT is a consideration, duplex ultrasound ordered to evaluate for this.  It is reproducible with straight leg raise, this could be consistent with sciatica.  Reassuringly, there are no focal deficits on neuro exam and her reflexes are intact.  Alicia is additionally ambulatory at baseline gait and has establish care with spinal specialty.  I reviewed the duplex study which does not show any evidence of DVT.   Based on exam I think this is most likely sciatic pain.  I did order patient IM fentanyl for the pain.  I will send her home with a prednisone taper, Alicia already has establish care with neurosurgery and have encouraged her to call them secondary to schedule repeat appointment.  We discussed return precautions, patient discharged in stable condition.        Final Clinical Impression(s) / ED Diagnoses Final diagnoses:  Sciatica of right side    Rx / DC Orders ED Discharge Orders     None         Theron Arista, [place] 06/20/22 1834

## 2022-07-23 ENCOUNTER — Emergency Department (HOSPITAL_COMMUNITY)
Admission: EM | Admit: 2022-07-23 | Discharge: 2022-07-23 | Disposition: A | Discharge status: Home / Self Care | Payer: Medicare Other | Attending: Emergency Medicine | Admitting: Emergency Medicine

## 2022-07-23 ENCOUNTER — Other Ambulatory Visit: Payer: Self-pay

## 2022-07-23 ENCOUNTER — Emergency Department (HOSPITAL_COMMUNITY): Payer: Medicare Other

## 2022-07-23 ENCOUNTER — Encounter (HOSPITAL_COMMUNITY): Payer: Self-pay

## 2022-07-23 DIAGNOSIS — R32 Unspecified urinary incontinence: Secondary | ICD-10-CM | POA: Diagnosis not present

## 2022-07-23 DIAGNOSIS — M545 Low back pain, unspecified: Secondary | ICD-10-CM | POA: Diagnosis present

## 2022-07-23 DIAGNOSIS — M79604 Pain in right leg: Secondary | ICD-10-CM | POA: Diagnosis not present

## 2022-07-23 DIAGNOSIS — M5440 Lumbago with sciatica, unspecified side: Secondary | ICD-10-CM

## 2022-07-23 DIAGNOSIS — Z7982 Long term (current) use of aspirin: Secondary | ICD-10-CM | POA: Diagnosis not present

## 2022-07-23 LAB — CBC WITH DIFFERENTIAL/PLATELET
Abs Immature Granulocytes: 0.03 10*3/uL (ref 0.00–0.07)
Basophils Absolute: 0.1 10*3/uL (ref 0.0–0.1)
Basophils Relative: 1 %
Eosinophils Absolute: 0.2 10*3/uL (ref 0.0–0.5)
Eosinophils Relative: 3 %
HCT: 43.5 % (ref 36.0–46.0)
Hemoglobin: 14.6 g/dL (ref 12.0–15.0)
Immature Granulocytes: 0 %
Lymphocytes Relative: 31 %
Lymphs Abs: 3 10*3/uL (ref 0.7–4.0)
MCH: 31.9 pg (ref 26.0–34.0)
MCHC: 33.6 g/dL (ref 30.0–36.0)
MCV: 95.2 fL (ref 80.0–100.0)
Monocytes Absolute: 0.8 10*3/uL (ref 0.1–1.0)
Monocytes Relative: 8 %
Neutro Abs: 5.5 10*3/uL (ref 1.7–7.7)
Neutrophils Relative %: 57 %
Platelets: 428 10*3/uL — ABNORMAL HIGH (ref 150–400)
RBC: 4.57 MIL/uL (ref 3.87–5.11)
RDW: 11.4 % — ABNORMAL LOW (ref 11.5–15.5)
WBC: 9.6 10*3/uL (ref 4.0–10.5)
nRBC: 0 % (ref 0.0–0.2)

## 2022-07-23 LAB — BASIC METABOLIC PANEL
Anion gap: 10 (ref 5–15)
BUN: 23 mg/dL — ABNORMAL HIGH (ref 6–20)
CO2: 25 mmol/L (ref 22–32)
Calcium: 9.4 mg/dL (ref 8.9–10.3)
Chloride: 103 mmol/L (ref 98–111)
Creatinine, Ser: 0.85 mg/dL (ref 0.44–1.00)
GFR, Estimated: >60 mL/min (ref >60)
Glucose, Bld: 99 mg/dL (ref 70–99)
Potassium: 3.9 mmol/L (ref 3.5–5.1)
Sodium: 138 mmol/L (ref 135–145)

## 2022-07-23 LAB — SEDIMENTATION RATE: Sed Rate: 2 mm/hr (ref 0–22)

## 2022-07-23 MED ORDER — OXYCODONE-ACETAMINOPHEN 5-325 MG PO TABS
2.0000 | ORAL_TABLET | Freq: Once | ORAL | Status: AC
  Administered 2022-07-23: 2 via ORAL
  Filled 2022-07-23: qty 2

## 2022-07-23 MED ORDER — OXYCODONE-ACETAMINOPHEN 5-325 MG PO TABS: 1.0000 | ORAL_TABLET | Freq: Four times a day (QID) | ORAL | 0 refills | Status: AC | PRN

## 2022-07-23 MED ORDER — HYDROMORPHONE HCL 1 MG/ML IJ SOLN
1.0000 mg | Freq: Once | INTRAMUSCULAR | Status: AC
  Administered 2022-07-23: 1 mg via INTRAVENOUS
  Filled 2022-07-23: qty 1

## 2022-07-23 MED ORDER — FAMOTIDINE IN NACL 20-0.9 MG/50ML-% IV SOLN
20.0000 mg | Freq: Once | INTRAVENOUS | Status: AC
  Administered 2022-07-23: 20 mg via INTRAVENOUS
  Filled 2022-07-23: qty 50

## 2022-07-23 MED ORDER — ONDANSETRON HCL 4 MG/2ML IJ SOLN
4.0000 mg | Freq: Once | INTRAMUSCULAR | Status: AC
  Administered 2022-07-23: 4 mg via INTRAVENOUS
  Filled 2022-07-23: qty 2

## 2022-07-23 MED ORDER — DIAZEPAM 5 MG PO TABS
5.0000 mg | ORAL_TABLET | Freq: Once | ORAL | Status: AC
  Administered 2022-07-23: 5 mg via ORAL
  Filled 2022-07-23: qty 1

## 2022-07-23 NOTE — ED Notes (Addendum)
Patient transported to MRI 

## 2022-07-23 NOTE — ED Triage Notes (Signed)
Pt BIB [facility] from the spinal clinic c/o sciatic pain for several months and had a steroid shot last week today woke up unable to control her bowel/urine. Pt given a 100 mcg of fentanyl.

## 2022-07-23 NOTE — ED Provider Triage Note (Signed)
Emergency Medicine Provider Triage Evaluation Note  [name] , a 51 y.o. female  was evaluated in triage.  Pt complains of right lower extremity pain.  This has been going on for multiple months, she is followed by sciatic clinic.  Seen today, sent to ED for cauda equina work-up because patient has sudden loss of bladder and bowel function as of yesterday.  Denies any new or direct trauma to the limb.  No fevers at home. Review of Systems  Per HPI  Physical Exam  BP (!) 149/93 (BP Location: Right Arm)   Pulse (!) 58   Temp 98.2 F (36.8 C) (Oral)   Resp 16   SpO2 95%  Gen:   Awake, no distress   Resp:  Normal effort  MSK:   Moves upper and lower limbs. Other:  Cranial nerves III through XII are grossly intact, grips ankle bilaterally.  Patient is unwilling to move the right lower extremity, skin without rashes and there is no calf tenderness.  DP and PT are palpable, lower extremity warm and well-perfused.  Rectal deferred.  Medical Decision Making  Medically screening exam initiated at 11:15 AM.  Appropriate orders placed.  Christal Lagerstrom was informed that the remainder of the evaluation will be completed by another provider, this initial triage assessment does not replace that evaluation, and the importance of remaining in the ED until their evaluation is complete.  Given known radiculopathy and sudden onset of urinary and fecal incontinence we will proceed with MRI to evaluate for cauda equina.   Theron Arista, PA-C 07/23/22 1117

## 2022-07-23 NOTE — ED Notes (Signed)
Medicated pt and notified MRI at this time.

## 2022-07-23 NOTE — ED Provider Notes (Signed)
[facility] EMERGENCY DEPARTMENT Provider Note   CSN: [id number] Arrival date & time: 07/23/22  1033     History {Add pertinent medical, surgical, social history, OB history to HPI:1} Chief Complaint  Patient presents with   Back Pain    [name] is a 51 y.o. female.  She is brought in by ambulance from her spine clinic for evaluation of worsening right-sided sciatic pain and new incontinence of urine.  She has been dealing with some right-sided sciatic pain for 3 months.  She said she just had injections a week ago that did not do anything.  They have her on muscle relaxants and NSAIDs without improvement.  Complaining of severe 10 out of 10 pain radiating down the posterior part of her right leg to her foot.  Since last evening she said she has been losing her urine.  No dysuria no fevers chills nausea vomiting.  The history is provided by the patient.  Back Pain Location:  Gluteal region Quality:  Stabbing Radiates to:  R posterior upper leg and R foot Pain severity:  Severe Pain is:  Same all the time Onset quality:  Gradual Duration:  3 months Timing:  Constant Progression:  Unchanged Chronicity:  New Context: not recent injury   Relieved by:  Nothing Worsened by:  Ambulation and bending Ineffective treatments:  Muscle relaxants and NSAIDs Associated symptoms: bladder incontinence and leg pain   Associated symptoms: no abdominal pain, no bowel incontinence, no chest pain, no dysuria, no fever and no weakness        Home Medications Prior to Admission medications   Medication Sig Start Date End Date Taking? Authorizing Provider  albuterol (PROVENTIL HFA;VENTOLIN HFA) 108 (90 BASE) MCG/ACT inhaler Inhale 2 puffs into the lungs every 6 (six) hours as needed for wheezing or shortness of breath. 08/09/14   Oneal Grout, MD  albuterol (VENTOLIN HFA) 108 (90 Base) MCG/ACT inhaler INHALE TWO PUFFS INTO LUNGS EVERY 6 HOURS AS NEEDED FOR WHEEZING 03/26/19    [provider]  amLODipine (NORVASC) 10 MG tablet TAKE ONE TABLET BY MOUTH ONCE DAILY 04/23/19   [provider]  aspirin 81 MG tablet Take 1 tablet (81 mg total) by mouth daily. 04/05/14   Oneal Grout, MD  citalopram (CELEXA) 20 MG tablet Take 1 tablet (20 mg total) by mouth daily. Patient not taking: Reported on 04/12/2015 12/20/14   Oneal Grout, MD  diclofenac Sodium (VOLTAREN) 1 % GEL Apply 4 g topically 4 (four) times daily. 01/25/21   Jeannie Fend, PA-C  dicyclomine (BENTYL) 20 MG tablet Take 1 tablet (20 mg total) by mouth 2 (two) times daily. 01/02/19   Fawze, Mina A, PA-C  fluconazole (DIFLUCAN) 150 MG tablet Take 1 tablet (150 mg total) by mouth once a week. Take 1 pill once weekly for 2 to 4 weeks. 09/23/21   Gustavus Bryant, FNP  hydrochlorothiazide (HYDRODIURIL) 25 MG tablet Take by mouth. 04/30/19   [provider]  ibuprofen (ADVIL,MOTRIN) 800 MG tablet Take 1 tablet (800 mg total) by mouth every 8 (eight) hours as needed for up to 30 doses. 03/25/19   Curatolo, Adam, DO  lidocaine (LIDODERM) 5 % Place 1 patch onto the skin daily. Remove & Discard patch within 12 hours or as directed by MD 05/06/22   Prosperi, Harrel Carina, PA-C  linaclotide Karlene Einstein) 72 MCG capsule Take by mouth. 05/28/19   [provider]  lisinopril (PRINIVIL,ZESTRIL) 20 MG tablet Take one tablet by mouth once  daily for blood pressure 08/09/14   Oneal Grout, MD  methocarbamol (ROBAXIN) 500 MG tablet Take 1 tablet (500 mg total) by mouth 2 (two) times daily. 05/06/22   Prosperi, Christian H, PA-C  metoprolol succinate (TOPROL-XL) 25 MG 24 hr tablet Take 0.5 tablets (12.5 mg total) by mouth daily. 01/08/19   Alwyn Ren, MD  pravastatin (PRAVACHOL) 10 MG tablet Take 1 tablet (10 mg total) by mouth daily at 6 PM. 01/07/19   Alwyn Ren, MD  predniSONE (DELTASONE) 10 MG tablet Take 2 tablets (20 mg total) by mouth daily. 06/20/22   Theron Arista, PA-C  saccharomyces boulardii  (FLORASTOR) 250 MG capsule Take 1 capsule (250 mg total) by mouth 2 (two) times daily. 01/07/19   Alwyn Ren, MD      Allergies    Quetiapine fumarate, Risperdal [risperidone], Naproxen, Risperidone and related, and Seroquel [quetiapine fumarate]    Review of Systems   Review of Systems  Constitutional:  Negative for fever.  HENT:  Negative for sore throat.   Respiratory:  Negative for shortness of breath.   Cardiovascular:  Negative for chest pain.  Gastrointestinal:  Negative for abdominal pain and bowel incontinence.  Genitourinary:  Positive for bladder incontinence. Negative for dysuria.  Musculoskeletal:  Positive for back pain.  Skin:  Negative for rash.  Neurological:  Negative for speech difficulty and weakness.    Physical Exam Updated Vital Signs BP 139/71 (BP Location: Right Arm)   Pulse (!) 54   Temp 98.5 F (36.9 C) (Oral)   Resp 16   Ht 5\' 4"  (1.626 m)   SpO2 97%   BMI 33.30 kg/m  Physical Exam Vitals and nursing note reviewed.  Constitutional:      General: She is not in acute distress.    Appearance: Normal appearance. She is well-developed.  HENT:     Head: Normocephalic and atraumatic.  Eyes:     Conjunctiva/sclera: Conjunctivae normal.  Cardiovascular:     Rate and Rhythm: Normal rate and regular rhythm.     Heart sounds: No murmur heard. Pulmonary:     Effort: Pulmonary effort is normal. No respiratory distress.     Breath sounds: Normal breath sounds.  Abdominal:     Palpations: Abdomen is soft.     Tenderness: There is no abdominal tenderness. There is no guarding or rebound.  Musculoskeletal:        General: Normal range of motion.     Cervical back: Neck supple.     Right lower leg: No edema.     Left lower leg: No edema.     Comments: There is no midline spine tenderness.  She has some reproducible tenderness on palpation over right gluteal sciatic notch area.  She has pain radiating down her right leg.  Distal pulses motor and  sensation intact.  Skin:    General: Skin is warm and dry.     Capillary Refill: Capillary refill takes less than 2 seconds.  Neurological:     General: No focal deficit present.     Mental Status: She is alert.     Sensory: No sensory deficit.     Motor: No weakness.     ED Results / Procedures / Treatments   Labs (all labs ordered are listed, but only abnormal results are displayed) Labs Reviewed  CBC WITH DIFFERENTIAL/PLATELET - Abnormal; Notable for the following components:      Result Value   RDW 11.4 (*)    Platelets 428 (*)  All other components within normal limits  SEDIMENTATION RATE  URINALYSIS, ROUTINE W REFLEX MICROSCOPIC  BASIC METABOLIC PANEL    EKG None  Radiology No results found.  Procedures Procedures  {Document cardiac monitor, telemetry assessment procedure when appropriate:1}  Medications Ordered in ED Medications  oxyCODONE-acetaminophen (PERCOCET/ROXICET) 5-325 MG per tablet 2 tablet (has no administration in time range)    ED Course/ Medical Decision Making/ A&P                           Medical Decision Making Risk Prescription drug management.  This patient complains of ***; this involves an extensive number of treatment Options and is a complaint that carries with it a high risk of complications and morbidity. The differential includes ***  I ordered, reviewed and interpreted labs, which included *** I ordered medication *** and reviewed PMP when indicated. I ordered imaging studies which included *** and I independently    visualized and interpreted imaging which showed *** Additional history obtained from *** Previous records obtained and reviewed *** I consulted *** and discussed lab and imaging findings and discussed disposition.  Cardiac monitoring reviewed, *** Social determinants considered, *** Critical Interventions: ***  After the interventions stated above, I reevaluated the patient and found *** Admission and  further testing considered, ***    {Document critical care time when appropriate:1} {Document review of labs and clinical decision tools ie heart score, Chads2Vasc2 etc:1}  {Document your independent review of radiology images, and any outside records:1} {Document your discussion with family members, caretakers, and with consultants:1} {Document social determinants of health affecting pt's care:1} {Document your decision making why or why not admission, treatments were needed:1} Final Clinical Impression(s) / ED Diagnoses Final diagnoses:  None    Rx / DC Orders ED Discharge Orders     None

## 2022-07-23 NOTE — Discharge Instructions (Signed)
You are seen in the emergency department for worsening low back pain.  You had an MRI that showed stable findings.  We are prescribing you some narcotic pain medication.  Please continue your muscle relaxants and follow-up with your neurosurgery team.  Return to the emergency department if any worsening or concerning symptoms.

## 2022-07-23 NOTE — ED Notes (Signed)
Pt unable to tolerate lying flat for MRI. Dr. Charm Barges aware.

## 2022-07-23 NOTE — Progress Notes (Signed)
2nd attempt to obtain MRI and patient still could not lay flat on exam table.  Complained of pain and RN notified.

## 2022-07-23 NOTE — ED Notes (Signed)
Pt c/o cramping in LLE. Pt assisted to lay on stretcher and repositioned. Pt given Ice pack. Dr. Charm Barges aware.

## 2022-08-02 DIAGNOSIS — M533 Sacrococcygeal disorders, not elsewhere classified: Secondary | ICD-10-CM | POA: Insufficient documentation

## 2023-03-10 DIAGNOSIS — Z9689 Presence of other specified functional implants: Secondary | ICD-10-CM | POA: Insufficient documentation

## 2023-08-25 DIAGNOSIS — E66811 Obesity, class 1: Secondary | ICD-10-CM | POA: Insufficient documentation

## 2023-10-09 DIAGNOSIS — K644 Residual hemorrhoidal skin tags: Secondary | ICD-10-CM | POA: Insufficient documentation

## 2023-10-29 DIAGNOSIS — R0683 Snoring: Secondary | ICD-10-CM | POA: Insufficient documentation

## 2024-05-20 ENCOUNTER — Encounter: Payer: Self-pay | Admitting: Emergency Medicine

## 2024-05-20 ENCOUNTER — Ambulatory Visit: Admission: EM | Admit: 2024-05-20 | Discharge: 2024-05-20 | Disposition: A | Discharge status: Home / Self Care

## 2024-05-20 DIAGNOSIS — R040 Epistaxis: Secondary | ICD-10-CM

## 2024-05-20 NOTE — ED Triage Notes (Addendum)
 Pt reports nose bleed that started at 4:15pm today. Pt has cloth away from her nose in triage and bleeding seems to have stopped now. Pt reports she had a nasal surgery and tonsil surgery completed on 05/05/24 and thinks this is related. She is unsure of specific surgery and not seeing any notes in chart review. States papers may be in the car she will bring them in. (Pt brought paper in stating tonsillectomy, palatoplasty, and nasal cautery)

## 2024-05-20 NOTE — ED Provider Notes (Signed)
 [facility]    CSN: [id number] Arrival date & time: 05/20/24  1716      History   Chief Complaint Chief Complaint  Patient presents with   Epistaxis    HPI [name] is a 53 y.o. female.   [name] is a 53 year old female presenting after experiencing a nosebleed earlier today. The bleeding began around 4:15 PM from the left nostril and stopped shortly after arrival to urgent care. She has no prior history of nosebleeds and suspects this episode may be related to her recent tonsillectomy performed on May 05, 2024. The patient is unsure of all procedures completed during the surgery but has been following post-operative instructions, including the use of saline nasal spray, avoiding forceful coughing or nose blowing, and applying antibiotic ointment inside the nostrils. She continues to experience throat pain with swallowing and reports that she is still unable to take a full yawn. She also describes right-sided ear pain, which she characterizes as a sensation of air or pressure, while the left ear is minimally symptomatic. The patient denies dizziness, weakness, or any bleeding or bruising elsewhere. She is not currently taking aspirin , which she typically uses, as advised while awaiting her ENT follow-up. She continues to eat popsicles as part of her post-operative diet.  The following portions of the patient's history were reviewed and updated as appropriate: allergies, current medications, past family history, past medical history, past social history, past surgical history, and problem list.    Past Medical History:  Diagnosis Date   Allergic rhinitis due to pollen    Allergic rhinitis due to pollen    Asthma    Bipolar disorder (HCC)    CHF (congestive heart failure) (HCC)    Cocaine dependence (HCC) 2012   Depressive disorder, not elsewhere classified    Hypertension    Obesity, unspecified    Osteoporosis, unspecified    Other abnormal blood  chemistry    Other and unspecified hyperlipidemia    Other and unspecified hyperlipidemia    Other convulsions    Other malaise and fatigue    Reflux esophagitis    Sarcoidosis    Seizure disorder (HCC)    Tobacco use disorder    stopped smoking   Unspecified constipation    Unspecified hereditary and idiopathic peripheral neuropathy    Unspecified late effects of cerebrovascular disease    Unspecified sleep apnea    Unspecified vitamin D  deficiency     Patient Active Problem List   Diagnosis Date Noted   Snoring 10/29/2023   Anal skin tag 10/09/2023   Obesity, Class I, BMI 30-34.9 08/25/2023   Spinal cord stimulator status 03/10/2023   Sacroiliac joint pain 08/02/2022   Lumbar radiculopathy 06/18/2022   Foraminal stenosis of lumbosacral region 05/14/2022   Lumbar degenerative disc disease 05/14/2022   Lumbar facet joint syndrome 05/14/2022   CIN III (cervical intraepithelial neoplasia grade III) with severe dysplasia 07/14/2020   Left carpal tunnel syndrome 07/13/2020   Numbness and tingling of left hand 04/23/2019   Numbness of left foot 04/23/2019   Hypertensive urgency 01/04/2019   Acute colitis 01/04/2019   Colitis, acute 01/04/2019   Nausea and vomiting    Former smoker 08/07/2018   Bipolar 1 disorder (HCC) 09/17/2016   H/O: CVA (cerebrovascular accident) 05/25/2015   Anxiety 05/25/2015   Irritable bowel syndrome without diarrhea 05/25/2015   Osteopenia 05/25/2015   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 04/12/2015   Hyperlipidemia LDL goal <100 12/20/2014   Chronic  diastolic congestive heart failure (HCC) 04/27/2014   s 04/27/2014   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   Encounter for therapeutic drug monitoring 04/27/2014   COPD (chronic obstructive pulmonary disease) (HCC) 04/05/2014   CHF (congestive heart failure) (HCC) 04/05/2014   Muscle weakness of right arm 04/05/2014   Tinea versicolor 09/08/2013   Hyperlipidemia 06/08/2013    Peripheral neuropathy 06/08/2013   Essential hypertension 06/08/2013   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      Home Medications    Prior to Admission medications   Medication Sig Start Date End Date Taking? Authorizing Provider  albuterol  (PROVENTIL  HFA;VENTOLIN  HFA) 108 (90 BASE) MCG/ACT inhaler Inhale 2 puffs into the lungs every 6 (six) hours as needed for wheezing or shortness of breath. 08/09/14  Yes Burnette Carte, MD  amLODipine  (NORVASC ) 10 MG tablet TAKE ONE TABLET BY MOUTH ONCE DAILY 04/23/19  Yes [provider]  aspirin  81 MG tablet Take 1 tablet (81 mg total) by mouth daily. 04/05/14  Yes Pandey, Mahima, MD  clindamycin-benzoyl peroxide (BENZACLIN) gel APPLY A THIN LAYER TO AFFECTED AREAS TOPICALLY DAILY 01/20/24  Yes [provider]  Dexlansoprazole  (DEXILANT ) 30 MG capsule DR Take 1 capsule by mouth daily. 07/18/22  Yes [provider]  dexlansoprazole  (DEXILANT ) 60 MG capsule Take 60 mg by mouth. 04/08/24  Yes [provider]  diclofenac  Sodium (VOLTAREN ) 1 % GEL Apply 4 g topically 4 (four) times daily. 01/25/21  Yes Darlis Eisenmenger, PA-C  dicyclomine  (BENTYL ) 20 MG tablet Take 1 tablet (20 mg total) by mouth 2 (two) times daily. 01/02/19  Yes Fawze, Mina A, PA-C  DULoxetine (CYMBALTA) 30 MG capsule Take by mouth. 08/02/22  Yes [provider]  famotidine  (PEPCID ) 20 MG tablet Take 1 tablet by mouth 2 (two) times daily. 07/15/22  Yes [provider]  fluticasone-salmeterol (ADVAIR) 250-50 MCG/ACT AEPB Inhale into the lungs. 05/20/22  Yes [provider]  gabapentin  (NEURONTIN ) 300 MG capsule  Take 300 mg by mouth. 07/17/22 07/22/24 Yes [provider]  hydrALAZINE  (APRESOLINE ) 25 MG tablet Take 25 mg by mouth. 02/17/24  Yes [provider]  hydrochlorothiazide (HYDRODIURIL) 25 MG tablet Take by mouth. 04/30/19  Yes [provider]  HYDROcodone-acetaminophen  (HYCET) 7.5-325 mg/15 ml solution SMARTSIG:7.5-10 Milliliter(s) By Mouth Every 4 Hours PRN 04/29/24  Yes [provider]  hydrocortisone  (ANUSOL -HC) 2.5 % rectal cream Apply rectally 2 times daily 09/10/23 09/09/24 Yes [provider]  hydrOXYzine (ATARAX) 50 MG tablet Take 1 tablet by mouth at bedtime. 06/21/22  Yes [provider]  ibuprofen  (ADVIL ,MOTRIN ) 800 MG tablet Take 1 tablet (800 mg total) by mouth every 8 (eight) hours as needed for up to 30 doses. 03/25/19  Yes Curatolo, Adam, DO  ketoconazole  (NIZORAL ) 2 % shampoo APPLY TOPICALLY TO AFFECTED AREAS TWICE WEEKLY 01/12/24  Yes [provider]  linaclotide (LINZESS) 145 MCG CAPS capsule Take 145 mcg by mouth. 04/08/24  Yes [provider]  losartan (COZAAR) 50 MG tablet TAKE ONE (1) TABLET BY MOUTH TWICE DAILY 09/24/21  Yes [provider]  methocarbamol  (ROBAXIN ) 500 MG tablet Take 1  tablet (500 mg total) by mouth 2 (two) times daily. 05/06/22  Yes Prosperi, Christian H, PA-C  metoprolol  succinate (TOPROL -XL) 50 MG 24 hr tablet Take 1 tablet by mouth daily. 04/08/24  Yes [provider]  montelukast (SINGULAIR) 10 MG tablet Take 1 tablet by mouth at bedtime. 03/18/22  Yes [provider]  mupirocin ointment (BACTROBAN) 2 % Apply topically daily. 04/29/24  Yes [provider]  ondansetron  (ZOFRAN -ODT) 8 MG disintegrating tablet Take 8 mg by mouth every 8 (eight) hours as needed. Patient not taking: Reported on 05/20/2024 04/29/24   [provider]  pravastatin  (PRAVACHOL ) 10 MG tablet Take 1 tablet (10 mg total) by mouth daily at 6 PM. 01/07/19  Yes Barbee Lew, MD  Prenatal  Vit-Fe Fumarate-FA (MULTIVITAMIN-PRENATAL) 27-0.8 MG TABS tablet Take 1 tablet by mouth daily.   Yes [provider]  spironolactone (ALDACTONE) 25 MG tablet Take 25 mg by mouth. 01/22/24  Yes [provider]  topiramate (TOPAMAX) 25 MG tablet Take 1 tablet by mouth every evening. 06/10/22  Yes [provider]  albuterol  (VENTOLIN  HFA) 108 (90 Base) MCG/ACT inhaler INHALE TWO PUFFS INTO LUNGS EVERY 6 HOURS AS NEEDED FOR WHEEZING 03/26/19   [provider]  busPIRone (BUSPAR) 10 MG tablet Take 10 mg by mouth. Patient not taking: Reported on 05/20/2024 06/11/22   [provider]  citalopram  (CELEXA ) 20 MG tablet Take 1 tablet (20 mg total) by mouth daily. Patient not taking: Reported on 04/12/2015 12/20/14   Burnette Carte, MD  fluconazole  (DIFLUCAN ) 150 MG tablet Take 1 tablet (150 mg total) by mouth once a week. Take 1 pill once weekly for 2 to 4 weeks. Patient not taking: Reported on 05/20/2024 09/23/21   Mound, Haley E, FNP  furosemide (LASIX) 20 MG tablet Take 1 tablet by mouth as needed. Patient not taking: Reported on 05/20/2024 09/22/23   [provider]  lidocaine  (LIDODERM ) 5 % Place 1 patch onto the skin daily. Remove & Discard patch within 12 hours or as directed by MD Patient not taking: Reported on 05/20/2024 05/06/22   Prosperi, Christian H, PA-C  linaclotide Glory Larsen) 72 MCG capsule Take by mouth. 05/28/19   [provider]  lisinopril  (PRINIVIL ,ZESTRIL ) 20 MG tablet Take one tablet by mouth once daily for blood pressure Patient not taking: Reported on 05/20/2024 08/09/14   Burnette Carte, MD  meloxicam (MOBIC) 15 MG tablet Take 15 mg by mouth. Patient not taking: Reported on 05/20/2024 07/16/22   [provider]  metoprolol  succinate (TOPROL -XL) 25 MG 24 hr tablet Take 0.5 tablets (12.5 mg total) by mouth daily. 01/08/19   Barbee Lew, MD  Na Sulfate-K Sulfate-Mg Sulfate concentrate (SUPREP) 17.5-3.13-1.6 GM/177ML SOLN  Take 177 mLs by mouth. Patient not taking: Reported on 05/20/2024 04/08/24   [provider]  oxyCODONE -acetaminophen  (PERCOCET/ROXICET) 5-325 MG tablet Take 1 tablet by mouth every 6 (six) hours as needed for severe pain. Patient not taking: Reported on 05/20/2024 07/23/22   Tonya Fredrickson, MD  predniSONE  (DELTASONE ) 10 MG tablet Take 2 tablets (20 mg total) by mouth daily. Patient not taking: Reported on 05/20/2024 06/20/22   Delray Fielding, PA-C  saccharomyces boulardii (FLORASTOR) 250 MG capsule Take 1 capsule (250 mg total) by mouth 2 (two) times daily. Patient not taking: Reported on 05/20/2024 01/07/19   Barbee Lew, MD  tiZANidine (ZANAFLEX) 4 MG tablet Take 1 tablet by mouth every 8 (eight) hours as needed. Patient not taking: Reported on 05/20/2024 07/11/22  [provider]  traZODone (DESYREL) 50 MG tablet Take 50 mg by mouth. Patient not taking: Reported on 05/20/2024 06/11/22   [provider]  triamcinolone cream (KENALOG) 0.1 % APPLY CREAM TOPICALLY TO AFFECTED AREA TWICE DAILY AS NEEDED AVOID FACE,GROIN, AND UNDERARMS Patient not taking: Reported on 05/20/2024 01/11/22   [provider]    Family History Family History  Problem Relation Age of Onset   Diabetes Mother    Hypertension Mother    Hypertension Father    Stroke Father    Cancer Paternal Uncle        breast    Social History Social History   Tobacco Use   Smoking status: Former    Current packs/day: 0.00    Types: Cigarettes    Quit date: 05/30/2013    Years since quitting: 10.9   Smokeless tobacco: Never  Substance Use Topics   Alcohol use: No   Drug use: No     Allergies   Quetiapine fumarate, Risperdal [risperidone], Risperidone and related, Naproxen, and Seroquel [quetiapine fumarate]   Review of Systems Review of Systems  Constitutional:  Negative for fever.  HENT:  Positive for ear pain, nosebleeds and sore throat. Negative for trouble swallowing.    Respiratory:  Negative for cough.   Gastrointestinal:  Negative for vomiting.  Neurological:  Negative for dizziness and headaches.  Hematological:  Does not bruise/bleed easily.  All other systems reviewed and are negative.    Physical Exam Triage Vital Signs ED Triage Vitals  Encounter Vitals Group     BP 05/20/24 1815 123/79     Systolic BP Percentile --      Diastolic BP Percentile --      Pulse Rate 05/20/24 1815 (!) 57     Resp 05/20/24 1815 18     Temp 05/20/24 1815 97.8 F (36.6 C)     Temp Source 05/20/24 1815 Oral     SpO2 05/20/24 1815 100 %     Weight --      Height --      Head Circumference --      Peak Flow --      Pain Score 05/20/24 1817 0     Pain Loc --      Pain Education --      Exclude from Growth Chart --    No data found.  Updated Vital Signs BP 123/79 (BP Location: Left Arm)   Pulse (!) 57   Temp 97.8 F (36.6 C) (Oral)   Resp 18   SpO2 100%   Visual Acuity Right Eye Distance:   Left Eye Distance:   Bilateral Distance:    Right Eye Near:   Left Eye Near:    Bilateral Near:     Physical Exam Vitals reviewed.  Constitutional:      General: She is not in acute distress.    Appearance: Normal appearance. She is not toxic-appearing.  HENT:     Head: Normocephalic.     Nose: No nasal tenderness, mucosal edema, congestion or rhinorrhea.     Right Nostril: No epistaxis.     Left Nostril: No epistaxis.     Mouth/Throat:     Mouth: Mucous membranes are moist.  Eyes:     Conjunctiva/sclera: Conjunctivae normal.  Cardiovascular:     Rate and Rhythm: Normal rate and regular rhythm.     Heart sounds: Normal heart sounds.  Pulmonary:     Effort: Pulmonary effort is normal.  Breath sounds: Normal breath sounds.  Musculoskeletal:        General: Normal range of motion.     Cervical back: Full passive range of motion without pain, normal range of motion and neck supple.  Lymphadenopathy:     Cervical: No cervical adenopathy.   Skin:    General: Skin is warm and dry.     Findings: No bruising or ecchymosis.  Neurological:     General: No focal deficit present.     Mental Status: She is alert and oriented to person, place, and time.      UC Treatments / Results  Labs (all labs ordered are listed, but only abnormal results are displayed) Labs Reviewed - No data to display  EKG   Radiology No results found.  Procedures Procedures (including critical care time)  Medications Ordered in UC Medications - No data to display  Initial Impression / Assessment and Plan / UC Course  I have reviewed the triage vital signs and the nursing notes.  Pertinent labs & imaging results that were available during my care of the patient were reviewed by me and considered in my medical decision making (see chart for details).     53 year old female with history of tonsillectomy on 05/05/24 presents after a single episode of left-sided epistaxis that began around 4:15 PM today and resolved while in the waiting room of the urgent care. This is her first episode of nosebleed. Given the recent surgical history, the epistaxis may be related to post-operative healing or nasal dryness. She denies current use of blood thinners, though she has a history of aspirin  use, which she has not resumed since surgery. No other signs of systemic bleeding or recent illness were reported. Patient was advised to continue post-tonsillectomy care, including use of antibiotic ointment and saline spray, and to avoid nasal trauma or decongestant sprays. Instructions provided for managing recurrent nosebleeds at home, including direct pressure, ice application, and head elevation. A humidifier was recommended to maintain nasal moisture. Patient advised to follow up with ENT if nosebleeds persist or recur.  Today's evaluation has revealed no signs of a dangerous process. Discussed diagnosis with patient and/or guardian. Patient and/or guardian aware of  their diagnosis, possible red flag symptoms to watch out for and need for close follow up. Patient and/or guardian understands verbal and written discharge instructions. Patient and/or guardian comfortable with plan and disposition.  Patient and/or guardian has a clear mental status at this time, good insight into illness (after discussion and teaching) and has clear judgment to make decisions regarding their care  Documentation was completed with the aid of voice recognition software. Transcription may contain typographical errors.  Final Clinical Impressions(s) / UC Diagnoses   Final diagnoses:  Epistaxis     Discharge Instructions      You were seen today for a nosebleed. At this time, the bleeding has stopped, and there are no signs of serious complications. Nosebleeds are common and often occur when the inside of the nose becomes dry or irritated, such as from dry air, allergies, nose-picking, frequent blowing, or trauma. In most cases, nosebleeds are not dangerous and can be managed at home with simple care.  If you experience another nosebleed, sit upright and lean slightly forward. Pinch the soft part of your nose (just below the bridge) firmly for 10 to 15 minutes without releasing pressure. Avoid lying flat or tilting your head back, as this may cause blood to run down the throat. Once bleeding stops,  try not to blow your nose or bend over for several hours. Keep your head elevated when resting and avoid heavy lifting or strenuous activity for the next 24 hours.  To prevent future nosebleeds, keep the inside of your nose moist. You can apply a thin layer of petroleum jelly or use a saline nasal spray several times a day, especially during dry weather or while using indoor heat. Using a cool mist humidifier in your home can also help maintain moisture in the air.  If nosebleeds become frequent, prolonged, difficult to stop, or are associated with other symptoms such as dizziness,  weakness, or bleeding from other areas, please seek medical attention. Follow up with your ENT specialist as scheduled or sooner if needed.     ED Prescriptions   None    PDMP not reviewed this encounter.   Maryruth Sol, [place] 05/20/24 1940

## 2024-05-20 NOTE — Discharge Instructions (Addendum)
 You were seen today for a nosebleed. At this time, the bleeding has stopped, and there are no signs of serious complications. Nosebleeds are common and often occur when the inside of the nose becomes dry or irritated, such as from dry air, allergies, nose-picking, frequent blowing, or trauma. In most cases, nosebleeds are not dangerous and can be managed at home with simple care.  If you experience another nosebleed, sit upright and lean slightly forward. Pinch the soft part of your nose (just below the bridge) firmly for 10 to 15 minutes without releasing pressure. Avoid lying flat or tilting your head back, as this may cause blood to run down the throat. Once bleeding stops, try not to blow your nose or bend over for several hours. Keep your head elevated when resting and avoid heavy lifting or strenuous activity for the next 24 hours.  To prevent future nosebleeds, keep the inside of your nose moist. You can apply a thin layer of petroleum jelly or use a saline nasal spray several times a day, especially during dry weather or while using indoor heat. Using a cool mist humidifier in your home can also help maintain moisture in the air.  If nosebleeds become frequent, prolonged, difficult to stop, or are associated with other symptoms such as dizziness, weakness, or bleeding from other areas, please seek medical attention. Follow up with your ENT specialist as scheduled or sooner if needed.

## 2024-07-15 ENCOUNTER — Ambulatory Visit: Payer: Self-pay | Admitting: Neurology
